# Patient Record
Sex: Female | Born: 1975 | Race: White | Hispanic: No | Marital: Married | Smoking: Former smoker
Health system: Southern US, Community
[De-identification: ages and names within clinical notes are randomized; demographics above are authoritative.]

## PROBLEM LIST (undated history)

## (undated) DIAGNOSIS — K219 Gastro-esophageal reflux disease without esophagitis: Secondary | ICD-10-CM

## (undated) DIAGNOSIS — I10 Essential (primary) hypertension: Secondary | ICD-10-CM

## (undated) DIAGNOSIS — M419 Scoliosis, unspecified: Secondary | ICD-10-CM

## (undated) DIAGNOSIS — N390 Urinary tract infection, site not specified: Secondary | ICD-10-CM

## (undated) DIAGNOSIS — N200 Calculus of kidney: Secondary | ICD-10-CM

## (undated) DIAGNOSIS — Z22322 Carrier or suspected carrier of Methicillin resistant Staphylococcus aureus: Secondary | ICD-10-CM

## (undated) DIAGNOSIS — Z87442 Personal history of urinary calculi: Secondary | ICD-10-CM

## (undated) HISTORY — PX: CYSTECTOMY: SUR359

## (undated) HISTORY — PX: KIDNEY STONE SURGERY: SHX686

---

## 2004-05-31 ENCOUNTER — Emergency Department (HOSPITAL_COMMUNITY): Admission: EM | Admit: 2004-05-31 | Discharge: 2004-05-31 | Payer: Self-pay | Admitting: *Deleted

## 2005-11-05 ENCOUNTER — Emergency Department (HOSPITAL_COMMUNITY): Admission: EM | Admit: 2005-11-05 | Discharge: 2005-11-05 | Payer: Self-pay | Admitting: Emergency Medicine

## 2005-11-07 ENCOUNTER — Ambulatory Visit: Payer: Self-pay | Admitting: Psychiatry

## 2005-11-07 ENCOUNTER — Other Ambulatory Visit (HOSPITAL_COMMUNITY): Admission: RE | Admit: 2005-11-07 | Discharge: 2005-12-07 | Payer: Self-pay | Admitting: Psychiatry

## 2008-09-29 ENCOUNTER — Observation Stay (HOSPITAL_COMMUNITY): Admission: EM | Admit: 2008-09-29 | Discharge: 2008-09-30 | Payer: Self-pay | Admitting: Emergency Medicine

## 2008-09-30 ENCOUNTER — Encounter (INDEPENDENT_AMBULATORY_CARE_PROVIDER_SITE_OTHER): Payer: Self-pay | Admitting: Urology

## 2010-02-19 ENCOUNTER — Emergency Department (HOSPITAL_COMMUNITY): Admission: EM | Admit: 2010-02-19 | Discharge: 2010-02-19 | Payer: Self-pay | Admitting: Emergency Medicine

## 2010-04-29 ENCOUNTER — Emergency Department (HOSPITAL_COMMUNITY): Admission: EM | Admit: 2010-04-29 | Discharge: 2010-04-29 | Payer: Self-pay | Admitting: Emergency Medicine

## 2010-07-07 ENCOUNTER — Emergency Department (HOSPITAL_COMMUNITY)
Admission: EM | Admit: 2010-07-07 | Discharge: 2010-07-07 | Payer: Self-pay | Source: Home / Self Care | Admitting: Emergency Medicine

## 2010-07-10 LAB — COMPREHENSIVE METABOLIC PANEL
ALT: 23 U/L (ref 0–35)
AST: 27 U/L (ref 0–37)
Albumin: 4.2 g/dL (ref 3.5–5.2)
Alkaline Phosphatase: 61 U/L (ref 39–117)
BUN: 9 mg/dL (ref 6–23)
CO2: 28 mEq/L (ref 19–32)
Calcium: 9.7 mg/dL (ref 8.4–10.5)
Chloride: 106 mEq/L (ref 96–112)
Creatinine, Ser: 0.73 mg/dL (ref 0.4–1.2)
GFR calc Af Amer: >60 mL/min (ref >60)
GFR calc non Af Amer: >60 mL/min (ref >60)
Glucose, Bld: 95 mg/dL (ref 70–99)
Potassium: 4.5 mEq/L (ref 3.5–5.1)
Sodium: 141 mEq/L (ref 135–145)
Total Bilirubin: 0.5 mg/dL (ref 0.3–1.2)
Total Protein: 7.9 g/dL (ref 6.0–8.3)

## 2010-07-10 LAB — URINALYSIS, ROUTINE W REFLEX MICROSCOPIC
Bilirubin Urine: NEGATIVE
Nitrite: POSITIVE — AB
Protein, ur: >300 mg/dL — AB
Specific Gravity, Urine: >1.03 — ABNORMAL HIGH (ref 1.005–1.030)
Urine Glucose, Fasting: 250 mg/dL — AB
Urobilinogen, UA: >8 mg/dL — ABNORMAL HIGH (ref 0.0–1.0)
pH: 5 (ref 5.0–8.0)

## 2010-07-10 LAB — DIFFERENTIAL
Basophils Absolute: 0.1 10*3/uL (ref 0.0–0.1)
Basophils Relative: 1 % (ref 0–1)
Eosinophils Absolute: 0.4 10*3/uL (ref 0.0–0.7)
Eosinophils Relative: 2 % (ref 0–5)
Lymphocytes Relative: 12 % (ref 12–46)
Lymphs Abs: 1.8 10*3/uL (ref 0.7–4.0)
Monocytes Absolute: 1.1 10*3/uL — ABNORMAL HIGH (ref 0.1–1.0)
Monocytes Relative: 7 % (ref 3–12)
Neutro Abs: 12 10*3/uL — ABNORMAL HIGH (ref 1.7–7.7)
Neutrophils Relative %: 78 % — ABNORMAL HIGH (ref 43–77)

## 2010-07-10 LAB — URINE MICROSCOPIC-ADD ON

## 2010-07-10 LAB — CBC
HCT: 39.8 % (ref 36.0–46.0)
Hemoglobin: 13.6 g/dL (ref 12.0–15.0)
MCH: 30.4 pg (ref 26.0–34.0)
MCHC: 34.2 g/dL (ref 30.0–36.0)
MCV: 88.8 fL (ref 78.0–100.0)
Platelets: 351 10*3/uL (ref 150–400)
RBC: 4.48 MIL/uL (ref 3.87–5.11)
RDW: 12.9 % (ref 11.5–15.5)
WBC: 15.4 10*3/uL — ABNORMAL HIGH (ref 4.0–10.5)

## 2010-07-10 LAB — PREGNANCY, URINE: Preg Test, Ur: NEGATIVE

## 2010-07-12 LAB — URINE CULTURE
Colony Count: 85000
Culture  Setup Time: 201201142022

## 2010-10-04 LAB — DIFFERENTIAL
Basophils Absolute: 0.1 10*3/uL (ref 0.0–0.1)
Basophils Absolute: 0.1 10*3/uL (ref 0.0–0.1)
Basophils Relative: 0 % (ref 0–1)
Basophils Relative: 1 % (ref 0–1)
Eosinophils Absolute: 0.3 10*3/uL (ref 0.0–0.7)
Eosinophils Absolute: 0.6 10*3/uL (ref 0.0–0.7)
Eosinophils Relative: 2 % (ref 0–5)
Eosinophils Relative: 5 % (ref 0–5)
Lymphocytes Relative: 7 % — ABNORMAL LOW (ref 12–46)
Lymphs Abs: 1.4 10*3/uL (ref 0.7–4.0)
Monocytes Absolute: 0.9 10*3/uL (ref 0.1–1.0)
Monocytes Relative: 4 % (ref 3–12)
Neutro Abs: 18.9 10*3/uL — ABNORMAL HIGH (ref 1.7–7.7)
Neutrophils Relative %: 58 % (ref 43–77)
Neutrophils Relative %: 88 % — ABNORMAL HIGH (ref 43–77)

## 2010-10-04 LAB — CBC
HCT: 37.9 % (ref 36.0–46.0)
HCT: 41.6 % (ref 36.0–46.0)
Hemoglobin: 14.2 g/dL (ref 12.0–15.0)
MCHC: 34.2 g/dL (ref 30.0–36.0)
MCHC: 34.2 g/dL (ref 30.0–36.0)
MCV: 89.6 fL (ref 78.0–100.0)
MCV: 89.9 fL (ref 78.0–100.0)
Platelets: 308 10*3/uL (ref 150–400)
Platelets: 330 10*3/uL (ref 150–400)
RBC: 4.64 MIL/uL (ref 3.87–5.11)
RDW: 13.2 % (ref 11.5–15.5)
RDW: 13.6 % (ref 11.5–15.5)
WBC: 11.5 10*3/uL — ABNORMAL HIGH (ref 4.0–10.5)
WBC: 21.6 10*3/uL — ABNORMAL HIGH (ref 4.0–10.5)

## 2010-10-04 LAB — BASIC METABOLIC PANEL
BUN: 6 mg/dL (ref 6–23)
BUN: 9 mg/dL (ref 6–23)
CO2: 29 mEq/L (ref 19–32)
Calcium: 9.5 mg/dL (ref 8.4–10.5)
Chloride: 103 mEq/L (ref 96–112)
Chloride: 107 mEq/L (ref 96–112)
Creatinine, Ser: 0.52 mg/dL (ref 0.4–1.2)
Creatinine, Ser: 0.68 mg/dL (ref 0.4–1.2)
GFR calc Af Amer: >60 mL/min (ref >60)
GFR calc non Af Amer: >60 mL/min (ref >60)
Glucose, Bld: 90 mg/dL (ref 70–99)
Glucose, Bld: 92 mg/dL (ref 70–99)
Potassium: 3.4 mEq/L — ABNORMAL LOW (ref 3.5–5.1)
Potassium: 4.5 mEq/L (ref 3.5–5.1)
Sodium: 139 mEq/L (ref 135–145)

## 2010-10-04 LAB — URINALYSIS, ROUTINE W REFLEX MICROSCOPIC
Glucose, UA: NEGATIVE mg/dL
Ketones, ur: NEGATIVE mg/dL
Nitrite: POSITIVE — AB
Protein, ur: 30 mg/dL — AB
Specific Gravity, Urine: >1.03 — ABNORMAL HIGH (ref 1.005–1.030)
Urobilinogen, UA: 1 mg/dL (ref 0.0–1.0)
pH: 5.5 (ref 5.0–8.0)

## 2010-10-04 LAB — STONE ANALYSIS: Stone Weight KSTONE: 0.091 g

## 2010-10-04 LAB — PREGNANCY, URINE: Preg Test, Ur: NEGATIVE

## 2010-10-04 LAB — URINE MICROSCOPIC-ADD ON

## 2010-10-04 LAB — URINE CULTURE: Colony Count: >=100000

## 2010-11-07 NOTE — Op Note (Signed)
NAME:  FATINA, SPRANKLE NO.:  [id number]   MEDICAL RECORD NO.:  [id number]          PATIENT TYPE:  OBV   LOCATION:  IC08                          FACILITY:  [facility]   PHYSICIAN:  Dennie Maizes, M.D.   DATE OF BIRTH:  04-Jan-1976   DATE OF PROCEDURE:  09/30/2008  DATE OF DISCHARGE:                               OPERATIVE REPORT   PREOPERATIVE DIAGNOSES:  Left distal ureteral calculi with obstruction,  left hydronephrosis, left renal colic.   POSTOPERATIVE DIAGNOSES:  Left distal ureteral calculi with obstruction,  left hydronephrosis, left renal colic.   PROCEDURE:  Operative cystoscopy, left retrograde pyelogram, left  ureteroscopic stone extraction, and left ureteral stent placement.   ANESTHESIA:  General.   SURGEON:  Dennie Maizes, MD   COMPLICATIONS:  None.   ESTIMATED BLOOD LOSS:  Minimal.   DRAINS:  6-French 26-cm size left ureteral stent with a string.   SPECIMEN:  Left ureteral calculus which was sent for chemical analysis.   A 35 year old female was admitted to hospital with severe left flank  pain.  Evaluation revealed a 7-mm size left upper ureteral calculus  without obstruction and hydronephrosis.  The patient was taken to  operating room today for cystoscopy, left retrograde pyelogram, left  ureteral stent placement, and possible ureteroscopic stone extraction.   DESCRIPTION OF PROCEDURE:  Preprocedure KUB revealed that the stone has  migrated to the left ureterovesical junction area.  General anesthesia  was induced and the patient was placed on the OR table in the dorsal  lithotomy position.  Lower abdomen and genitalia were prepped and draped  in a sterile fashion.  Cystoscopy was done with the 25-French scope.  Appearance of the bladder was normal.  A 5-French wedge catheter was  then placed in the left ureteral orifice.  Total 7 mL of IVP dye was  injected into the collecting system and retrograde pyelogram was done.  There was a large  filling defect in the distal ureter just above the  ureteral second junction measuring about 7 mm in size.  There was  proximal hydroureter and hydronephrosis.   An open-ended catheter was then placed in the left ureter.  A 0.138  Bentson guide was then inserted in the left collecting system without  any difficulty.  Distal ureter was dilated using an 18-French 6-cm  length balloon dilating catheter.  The balloon dilating catheter then  removed leaving the guidewire in place.   Ureteroscopy was done with a 8-French rigid ureteroscope.  The stone was  seen in the distal ureter.  The stone was trapped in a nitinol 14-mm 0-  tube wire basket and removed without any difficulty.  Examination of the  ureter up to the pelvic brim was normal.  A 6-French 26-cm size stent  with a string was then inserted in the left collecting system.  The  instruments were removed.  The patient was transferred to the PACU in a  satisfactory condition.      Dennie Maizes, M.D.  Electronically Signed     SK/MEDQ  D:  09/30/2008  T:  09/30/2008  Job:  [id number]   cc:   [facility]

## 2010-11-07 NOTE — H&P (Signed)
NAME:  Kathleen Perez, BUCHAN NO.:  [id number]   MEDICAL RECORD NO.:  [id number]          PATIENT TYPE:  EMS   LOCATION:  ED                            FACILITY:  [facility]   PHYSICIAN:  Dennie Maizes, M.D.   DATE OF BIRTH:  [birth year]   DATE OF ADMISSION:  09/29/2008  DATE OF DISCHARGE:  LH                              HISTORY & PHYSICAL   CHIEF COMPLAINT:  Severe left flank pain radiating to the front.   HISTORY OF PRESENT ILLNESS:  This 35 year old female has a past medical  history of recurrent urolithiasis.  She has passed several small stones  in the past.  She has not had any surgical intervention for stone  disease.  She has been treated for recurrent urinary tract infections in  the recent past.  She experienced sudden onset of severe left flank pain  radiating to the front since 3 a.m. today.  She came to the emergency  room at [facility] for further evaluation and management.  Her  pain scale was 8/10.  She denied having fever, chills, voiding  difficulty, gross hematuria or dysuria.   PAST MEDICAL HISTORY:  History of migraine headaches, recurrent urinary  tract infections.   MEDICATIONS:  None.   ALLERGIES:  None.   EXAMINATION:  The patient is comfortable after receiving IV narcotics.  HEAD, EYES, EARS, NOSE AND THROAT:  Normal.  LUNGS:  Clear to auscultation.  HEART:  Regular rate and rhythm.  No murmurs.  ABDOMEN:  Soft.  No palpable flank mass.  Moderate left costovertebral  angle tenderness was noted.   ADMISSION LABS:  Urinalysis:  Blood large, nitrate positive, leukocyte  esterase trace.  Urine pregnancy test negative.  CBC:  WBC 21.6,  hemoglobin 14.2, hematocrit 14.6.  BUN 9, creatinine 0.68, potassium  3.4.  Noncontrast CT scan of the abdomen and pelvis revealed moderate  left hydronephrosis with the proximal hydroureter.  There was a 9 x 5 x  5-mm size stone in the left proximal ureter.  There were no stones in  the right collecting  system.  The patient also has a right ovarian mass  about 4.7 x 3.8 cm in size.  It may be due to complex cyst or solid  mass.  Pelvic sonography is recommended by the radiologist.   IMPRESSION:  1. Left upper ureteral calculus with obstruction.  2. Left hydronephrosis.  3. Left renal colic.  4. Possible urinary tract infection and pyelonephritis.  5. Right adnexal mass.   PLAN:  1. Urine culture and sensitivity today.  The patient has been started      on IV Cipro.  2. Morphine PCA for pain relief.  3. Admit the patient for observation in the hospital.  4. I have discussed with the patient and her family regarding      management options.  She is scheduled to undergo cystoscopy, left      retrograde pyelogram and left ureteral stent placement under      anesthesia on September 30, 2008.  The obstructing stone will later be  treated with ESL as an outpatient.      Dennie Maizes, M.D.  Electronically Signed     SK/MEDQ  D:  09/29/2008  T:  09/29/2008  Job:  [id number]   cc:   [facility]

## 2010-11-13 ENCOUNTER — Emergency Department (HOSPITAL_COMMUNITY)
Admission: EM | Admit: 2010-11-13 | Discharge: 2010-11-13 | Disposition: A | Discharge status: Home / Self Care | Payer: Self-pay | Attending: Emergency Medicine | Admitting: Emergency Medicine

## 2010-11-13 DIAGNOSIS — R569 Unspecified convulsions: Secondary | ICD-10-CM | POA: Insufficient documentation

## 2010-11-13 LAB — POCT PREGNANCY, URINE: Preg Test, Ur: NEGATIVE

## 2011-02-14 ENCOUNTER — Emergency Department (HOSPITAL_COMMUNITY)
Admission: EM | Admit: 2011-02-14 | Discharge: 2011-02-14 | Disposition: A | Discharge status: Home / Self Care | Payer: Self-pay | Attending: Emergency Medicine | Admitting: Emergency Medicine

## 2011-02-14 DIAGNOSIS — Z87442 Personal history of urinary calculi: Secondary | ICD-10-CM | POA: Insufficient documentation

## 2011-02-14 DIAGNOSIS — R300 Dysuria: Secondary | ICD-10-CM | POA: Insufficient documentation

## 2011-02-14 DIAGNOSIS — N390 Urinary tract infection, site not specified: Secondary | ICD-10-CM

## 2011-02-14 DIAGNOSIS — F17200 Nicotine dependence, unspecified, uncomplicated: Secondary | ICD-10-CM | POA: Insufficient documentation

## 2011-02-14 DIAGNOSIS — R350 Frequency of micturition: Secondary | ICD-10-CM | POA: Insufficient documentation

## 2011-02-14 DIAGNOSIS — R109 Unspecified abdominal pain: Secondary | ICD-10-CM | POA: Insufficient documentation

## 2011-02-14 DIAGNOSIS — R319 Hematuria, unspecified: Secondary | ICD-10-CM | POA: Insufficient documentation

## 2011-02-14 HISTORY — DX: Urinary tract infection, site not specified: N39.0

## 2011-02-14 HISTORY — DX: Carrier or suspected carrier of methicillin resistant Staphylococcus aureus: Z22.322

## 2011-02-14 HISTORY — DX: Scoliosis, unspecified: M41.9

## 2011-02-14 HISTORY — DX: Calculus of kidney: N20.0

## 2011-02-14 LAB — URINALYSIS, ROUTINE W REFLEX MICROSCOPIC
Glucose, UA: 250 mg/dL — AB
Hgb urine dipstick: NEGATIVE
Protein, ur: 100 mg/dL — AB
Urobilinogen, UA: >8 mg/dL — ABNORMAL HIGH (ref 0.0–1.0)

## 2011-02-14 LAB — URINE MICROSCOPIC-ADD ON

## 2011-02-14 LAB — POCT PREGNANCY, URINE: Preg Test, Ur: NEGATIVE

## 2011-02-14 MED ORDER — CEPHALEXIN 500 MG PO CAPS: 500.0000 mg | ORAL_CAPSULE | Freq: Four times a day (QID) | ORAL | Status: AC

## 2011-02-14 MED ORDER — CEPHALEXIN 500 MG PO CAPS
500.0000 mg | ORAL_CAPSULE | Freq: Once | ORAL | Status: AC
  Administered 2011-02-14: 500 mg via ORAL
  Filled 2011-02-14: qty 1

## 2011-02-14 MED ORDER — HYDROCODONE-ACETAMINOPHEN 5-325 MG PO TABS: ORAL_TABLET | ORAL | Status: AC

## 2011-02-14 NOTE — ED Provider Notes (Signed)
History     CSN: [id number] Arrival date & time: 02/14/2011  9:50 AM  Chief Complaint  Patient presents with  . Urinary Frequency  . Dysuria   Patient is a 35 y.o. female presenting with dysuria. The history is provided by the patient.  Dysuria  This is a new problem. The current episode started more than 2 days ago. The problem occurs every urination. The problem has been gradually worsening. The quality of the pain is described as burning. The pain is moderate. There has been no fever. She is sexually active. There is no history of pyelonephritis. Associated symptoms include frequency, hematuria and urgency. Pertinent negatives include no chills, no sweats, no vomiting, no discharge, no possible pregnancy and no flank pain. She has tried nothing for the symptoms. Her past medical history is significant for kidney stones and recurrent UTIs. Her past medical history does not include urological procedure or catheterization.    Past Medical History  Diagnosis Date  . Kidney stone   . Scoliosis   . Urinary tract infection   . MRSA (methicillin resistant staph aureus) culture positive     Past Surgical History  Procedure Date  . Kidney stone surgery   . Cystectomy     No family history on file.  History  Substance Use Topics  . Smoking status: Current Everyday Smoker  . Smokeless tobacco: Not on file  . Alcohol Use: No    OB History    Grav Para Term Preterm Abortions TAB SAB Ect Mult Living                  Review of Systems  Constitutional: Negative for fever and chills.  Gastrointestinal: Negative for vomiting.  Genitourinary: Positive for dysuria, urgency, frequency and hematuria. Negative for flank pain, vaginal bleeding, vaginal discharge, menstrual problem and pelvic pain.  Musculoskeletal: Negative for back pain.  All other systems reviewed and are negative.    Physical Exam  BP 140/93  Pulse 73  Temp(Src) 98.4 F (36.9 C) (Oral)  Resp 20  Ht 5\' 6"  (1.676  m)  Wt 174 lb (78.926 kg)  BMI 28.08 kg/m2  SpO2 95%  LMP 01/25/2011  Physical Exam  Nursing note and vitals reviewed. Constitutional: She is oriented to person, place, and time. She appears well-developed and well-nourished. No distress.  HENT:  Head: Normocephalic and atraumatic.  Neck: Normal range of motion. No thyromegaly present.  Cardiovascular: Normal rate, regular rhythm and normal heart sounds.   Pulmonary/Chest: Effort normal and breath sounds normal.  Abdominal: Soft. She exhibits no distension and no mass. There is no hepatosplenomegaly. There is tenderness in the suprapubic area. There is no rigidity, no rebound, no guarding, no CVA tenderness and no tenderness at McBurney's point.  Musculoskeletal: She exhibits no edema and no tenderness.  Lymphadenopathy:    She has no cervical adenopathy.  Neurological: She is alert and oriented to person, place, and time.  Skin: Skin is warm and dry.    ED Course  Procedures  MDM   Patient is alert, NAd.  Non-toxic appearing.  No fever , vomiting or CVA tenderness to suggest pyleo. Onset of sx's has been gradual. Urine culture is pending.  Likely UTI.  I will start abx and pt agrees to rturn id sx's worsen.    I have reviewed the nursing notes and vitals signs.    Patient / Family / Caregiver understand and agree with initial ED impression and plan with expectations set for ED visit.  The patient appears reasonably screened and/or stabilized for discharge and I doubt any other medical condition or other [facility] requiring further screening, evaluation, or treatment in the ED at this time prior to discharge.       Dontrey Snellgrove L. Raimi Guillermo, [place] 02/22/11 1435

## 2011-02-14 NOTE — ED Notes (Signed)
Pt reports dysuria, pelvic pressure, and urinary frequency for the past week.  Pt has a hx of UTI, and kidney stones.  Pt reports that the pressure is getting worse.

## 2011-02-14 NOTE — ED Notes (Signed)
Left in c/o spouse for transport home; a&ox4; in no distress 

## 2011-02-16 LAB — URINE CULTURE: Colony Count: 80000

## 2011-02-17 NOTE — ED Notes (Signed)
Positive urine culture. Tx'd with Keflex, sensitive to same per protocol MD.

## 2011-02-23 NOTE — ED Provider Notes (Signed)
Medical screening examination/treatment/procedure(s) were performed by non-physician practitioner and as supervising physician I was immediately available for consultation/collaboration.   Benny Lennert, MD 02/23/11 [phone]

## 2012-05-08 ENCOUNTER — Encounter (HOSPITAL_COMMUNITY): Payer: Self-pay | Admitting: *Deleted

## 2012-05-08 ENCOUNTER — Emergency Department (HOSPITAL_COMMUNITY)
Admission: EM | Admit: 2012-05-08 | Discharge: 2012-05-08 | Disposition: A | Discharge status: Home / Self Care | Payer: Self-pay | Attending: Emergency Medicine | Admitting: Emergency Medicine

## 2012-05-08 DIAGNOSIS — Z87440 Personal history of urinary (tract) infections: Secondary | ICD-10-CM | POA: Insufficient documentation

## 2012-05-08 DIAGNOSIS — F17200 Nicotine dependence, unspecified, uncomplicated: Secondary | ICD-10-CM | POA: Insufficient documentation

## 2012-05-08 DIAGNOSIS — Z8739 Personal history of other diseases of the musculoskeletal system and connective tissue: Secondary | ICD-10-CM | POA: Insufficient documentation

## 2012-05-08 DIAGNOSIS — Z87442 Personal history of urinary calculi: Secondary | ICD-10-CM | POA: Insufficient documentation

## 2012-05-08 DIAGNOSIS — K089 Disorder of teeth and supporting structures, unspecified: Secondary | ICD-10-CM | POA: Insufficient documentation

## 2012-05-08 DIAGNOSIS — K0889 Other specified disorders of teeth and supporting structures: Secondary | ICD-10-CM

## 2012-05-08 DIAGNOSIS — Z8614 Personal history of Methicillin resistant Staphylococcus aureus infection: Secondary | ICD-10-CM | POA: Insufficient documentation

## 2012-05-08 MED ORDER — HYDROCODONE-ACETAMINOPHEN 5-325 MG PO TABS: 1.0000 | ORAL_TABLET | Freq: Four times a day (QID) | ORAL | Status: AC | PRN

## 2012-05-08 MED ORDER — PENICILLIN V POTASSIUM 250 MG PO TABS
500.0000 mg | ORAL_TABLET | Freq: Once | ORAL | Status: AC
  Administered 2012-05-08: 500 mg via ORAL
  Filled 2012-05-08: qty 2

## 2012-05-08 MED ORDER — HYDROCODONE-ACETAMINOPHEN 5-325 MG PO TABS
1.0000 | ORAL_TABLET | Freq: Once | ORAL | Status: AC
  Administered 2012-05-08: 1 via ORAL
  Filled 2012-05-08: qty 1

## 2012-05-08 MED ORDER — PENICILLIN V POTASSIUM 500 MG PO TABS: 500.0000 mg | ORAL_TABLET | Freq: Four times a day (QID) | ORAL | Status: AC

## 2012-05-08 NOTE — ED Provider Notes (Signed)
History     CSN: [id number]  Arrival date & time 05/08/12  1447   First MD Initiated Contact with Patient 05/08/12 1604      Chief Complaint  Patient presents with  . Dental Pain    (Consider location/radiation/quality/duration/timing/severity/associated sxs/prior treatment) HPI Comments: R lower wisdom tooth began hurting  This AM.  Took ibuprofen 600 mg  At 1130 with minimal relief.  Has appt to see dr. Retta Mac for extraction next week.  No fever or chills.  The history is provided by the patient. No language interpreter was used.    Past Medical History  Diagnosis Date  . Kidney stone   . Scoliosis   . Urinary tract infection   . MRSA (methicillin resistant staph aureus) culture positive     Past Surgical History  Procedure Date  . Kidney stone surgery   . Cystectomy     No family history on file.  History  Substance Use Topics  . Smoking status: Current Every Day Smoker  . Smokeless tobacco: Not on file  . Alcohol Use: No    OB History    Grav Para Term Preterm Abortions TAB SAB Ect Mult Living                  Review of Systems  Constitutional: Negative for fever and chills.  HENT: Positive for dental problem.   Cardiovascular: Negative for chest pain.  All other systems reviewed and are negative.    Allergies  Review of patient's allergies indicates no known allergies.  Home Medications   Current Outpatient Rx  Name  Route  Sig  Dispense  Refill  . PRENATAL MULTIVITAMIN CH   Oral   Take 1 tablet by mouth daily.         Marland Kitchen VITAMIN B-6 PO   Oral   Take 1 tablet by mouth daily.         Marland Kitchen HYDROCODONE-ACETAMINOPHEN 5-325 MG PO TABS   Oral   Take 1 tablet by mouth every 6 (six) hours as needed for pain.   20 tablet   0   . PENICILLIN V POTASSIUM 500 MG PO TABS   Oral   Take 1 tablet (500 mg total) by mouth 4 (four) times daily.   40 tablet   0     BP 147/94  Pulse 86  Temp 98.2 F (36.8 C) (Oral)  Resp 16  Ht 5\' 6"  (1.676  m)  Wt 200 lb (90.719 kg)  BMI 32.28 kg/m2  SpO2 98%  LMP 04/08/2012  Physical Exam  Nursing note and vitals reviewed. Constitutional: She is oriented to person, place, and time. She appears well-developed and well-nourished. No distress.  HENT:  Head: Normocephalic and atraumatic.  Mouth/Throat: Uvula is midline, oropharynx is clear and moist and mucous membranes are normal. No uvula swelling.    Eyes: EOM are normal.  Neck: Normal range of motion.  Cardiovascular: Normal rate and regular rhythm.   Pulmonary/Chest: Effort normal.  Abdominal: Soft. She exhibits no distension. There is no tenderness.  Musculoskeletal: Normal range of motion.  Neurological: She is alert and oriented to person, place, and time.  Skin: Skin is warm and dry.  Psychiatric: She has a normal mood and affect. Judgment normal.    ED Course  Procedures (including critical care time)  Labs Reviewed - No data to display No results found.   1. Pain, dental       MDM  rx-pen VK 500 mgQID x 10 days  rx-hydrocodone, 20 Ibuprofen F/u with dr. Retta Mac as planned.        Evalina Field, [place] 05/08/12 [phone]

## 2012-05-08 NOTE — ED Notes (Signed)
C/o dental pain to right side that started today.

## 2012-05-09 NOTE — ED Provider Notes (Signed)
History/physical exam/procedure(s) were performed by non-physician practitioner and as supervising physician I was immediately available for consultation/collaboration. I have reviewed all notes and am in agreement with care and plan.   Cresencia Asmus S Laureano Hetzer, MD 05/09/12 1513 

## 2013-06-08 ENCOUNTER — Encounter (HOSPITAL_COMMUNITY): Payer: Self-pay | Admitting: Emergency Medicine

## 2013-06-08 ENCOUNTER — Emergency Department (HOSPITAL_COMMUNITY)
Admission: EM | Admit: 2013-06-08 | Discharge: 2013-06-08 | Disposition: A | Discharge status: Home / Self Care | Payer: BC Managed Care – PPO | Attending: Emergency Medicine | Admitting: Emergency Medicine

## 2013-06-08 DIAGNOSIS — Z8739 Personal history of other diseases of the musculoskeletal system and connective tissue: Secondary | ICD-10-CM | POA: Insufficient documentation

## 2013-06-08 DIAGNOSIS — Z87440 Personal history of urinary (tract) infections: Secondary | ICD-10-CM | POA: Insufficient documentation

## 2013-06-08 DIAGNOSIS — F17200 Nicotine dependence, unspecified, uncomplicated: Secondary | ICD-10-CM | POA: Insufficient documentation

## 2013-06-08 DIAGNOSIS — Z87442 Personal history of urinary calculi: Secondary | ICD-10-CM | POA: Insufficient documentation

## 2013-06-08 DIAGNOSIS — K089 Disorder of teeth and supporting structures, unspecified: Secondary | ICD-10-CM | POA: Insufficient documentation

## 2013-06-08 DIAGNOSIS — Z79899 Other long term (current) drug therapy: Secondary | ICD-10-CM | POA: Insufficient documentation

## 2013-06-08 DIAGNOSIS — K029 Dental caries, unspecified: Secondary | ICD-10-CM | POA: Insufficient documentation

## 2013-06-08 DIAGNOSIS — Z8614 Personal history of Methicillin resistant Staphylococcus aureus infection: Secondary | ICD-10-CM | POA: Insufficient documentation

## 2013-06-08 DIAGNOSIS — K0889 Other specified disorders of teeth and supporting structures: Secondary | ICD-10-CM

## 2013-06-08 MED ORDER — IBUPROFEN 600 MG PO TABS: 600.0000 mg | ORAL_TABLET | Freq: Four times a day (QID) | ORAL | Status: DC | PRN

## 2013-06-08 MED ORDER — TRAMADOL HCL 50 MG PO TABS: 50.0000 mg | ORAL_TABLET | Freq: Four times a day (QID) | ORAL | Status: DC | PRN

## 2013-06-08 MED ORDER — AMOXICILLIN 500 MG PO CAPS: 500.0000 mg | ORAL_CAPSULE | Freq: Three times a day (TID) | ORAL | Status: AC

## 2013-06-08 NOTE — ED Notes (Signed)
Pt with dental pain since 0300 this morning, unable to see dentist

## 2013-06-11 NOTE — ED Provider Notes (Signed)
CSN: [id number]     Arrival date & time 06/08/13  1110 History   First MD Initiated Contact with Patient 06/08/13 1214     Chief Complaint  Patient presents with  . Dental Pain   (Consider location/radiation/quality/duration/timing/severity/associated sxs/prior Treatment) HPI Comments: Kathleen Perez is a 37 y.o. Female presenting with a 1 day history of dental pain and gingival swelling.   She denies prior problems with the tooth except has a cavity in the tooth.  Pain woke her from sleep around 3 am today.  There has been no fevers,  Chills, nausea or vomiting, also no complaint of difficulty swallowing,  Although chewing makes pain worse.  The patient has tried ibuprofen without relief of symptoms.         The history is provided by the patient.    Past Medical History  Diagnosis Date  . Kidney stone   . Scoliosis   . Urinary tract infection   . MRSA (methicillin resistant staph aureus) culture positive    Past Surgical History  Procedure Laterality Date  . Kidney stone surgery    . Cystectomy     History reviewed. No pertinent family history. History  Substance Use Topics  . Smoking status: Current Every Day Smoker    Types: Cigarettes  . Smokeless tobacco: Not on file  . Alcohol Use: No   OB History   Grav Para Term Preterm Abortions TAB SAB Ect Mult Living                 Review of Systems  Constitutional: Negative for fever.  HENT: Positive for dental problem. Negative for facial swelling and sore throat.   Respiratory: Negative for shortness of breath.   Musculoskeletal: Negative for neck pain and neck stiffness.    Allergies  Review of patient's allergies indicates no known allergies.  Home Medications   Current Outpatient Rx  Name  Route  Sig  Dispense  Refill  . ferrous sulfate 325 (65 FE) MG tablet   Oral   Take 325 mg by mouth daily with breakfast.         . norethindrone-ethinyl estradiol (NECON,BREVICON,MODICON) 0.5-35 MG-MCG tablet  Oral   Take 1 tablet by mouth daily.         Marland Kitchen amoxicillin (AMOXIL) 500 MG capsule   Oral   Take 1 capsule (500 mg total) by mouth 3 (three) times daily.   30 capsule   0   . ibuprofen (ADVIL,MOTRIN) 600 MG tablet   Oral   Take 1 tablet (600 mg total) by mouth every 6 (six) hours as needed.   30 tablet   0   . traMADol (ULTRAM) 50 MG tablet   Oral   Take 1 tablet (50 mg total) by mouth every 6 (six) hours as needed.   15 tablet   0    BP 154/96  Pulse 93  Temp(Src) 98 F (36.7 C) (Oral)  Resp 20  Ht 5\' 6"  (1.676 m)  Wt 210 lb (95.255 kg)  BMI 33.91 kg/m2  SpO2 98%  LMP 05/22/2013 Physical Exam  Constitutional: She is oriented to person, place, and time. She appears well-developed and well-nourished. No distress.  HENT:  Head: Normocephalic and atraumatic.  Right Ear: Tympanic membrane and external ear normal.  Left Ear: Tympanic membrane and external ear normal.  Mouth/Throat: Oropharynx is clear and moist and mucous membranes are normal. No oral lesions. Dental caries present.    Surrounding gingival erythema. No fluctuance or edema.  Eyes: Conjunctivae are normal.  Neck: Normal range of motion. Neck supple.  Cardiovascular: Normal rate and normal heart sounds.   Pulmonary/Chest: Effort normal.  Abdominal: She exhibits no distension.  Musculoskeletal: Normal range of motion.  Lymphadenopathy:    She has no cervical adenopathy.  Neurological: She is alert and oriented to person, place, and time.  Skin: Skin is warm and dry. No erythema.  Psychiatric: She has a normal mood and affect.    ED Course  Procedures (including critical care time) Labs Review Labs Reviewed - No data to display Imaging Review No results found.  EKG Interpretation   None       MDM   1. Pain, dental    Probably early dental infection. Prescribed amoxil, ibuprofen, tramadol.  Dental referrals given.    Burgess Amor, PA-C 06/11/13 1939

## 2013-06-12 NOTE — ED Provider Notes (Signed)
Medical screening examination/treatment/procedure(s) were performed by non-physician practitioner and as supervising physician I was immediately available for consultation/collaboration.  EKG Interpretation   None        Donnetta Hutching, MD 06/12/13 1826

## 2013-12-16 ENCOUNTER — Encounter (HOSPITAL_COMMUNITY): Payer: Self-pay | Admitting: Emergency Medicine

## 2013-12-16 DIAGNOSIS — F17200 Nicotine dependence, unspecified, uncomplicated: Secondary | ICD-10-CM | POA: Insufficient documentation

## 2013-12-16 DIAGNOSIS — Z8614 Personal history of Methicillin resistant Staphylococcus aureus infection: Secondary | ICD-10-CM | POA: Insufficient documentation

## 2013-12-16 DIAGNOSIS — Z8739 Personal history of other diseases of the musculoskeletal system and connective tissue: Secondary | ICD-10-CM | POA: Insufficient documentation

## 2013-12-16 DIAGNOSIS — Z79899 Other long term (current) drug therapy: Secondary | ICD-10-CM | POA: Insufficient documentation

## 2013-12-16 DIAGNOSIS — Z3202 Encounter for pregnancy test, result negative: Secondary | ICD-10-CM | POA: Insufficient documentation

## 2013-12-16 DIAGNOSIS — Z9089 Acquired absence of other organs: Secondary | ICD-10-CM | POA: Insufficient documentation

## 2013-12-16 DIAGNOSIS — Z87440 Personal history of urinary (tract) infections: Secondary | ICD-10-CM | POA: Insufficient documentation

## 2013-12-16 DIAGNOSIS — R109 Unspecified abdominal pain: Secondary | ICD-10-CM | POA: Insufficient documentation

## 2013-12-16 DIAGNOSIS — Z87442 Personal history of urinary calculi: Secondary | ICD-10-CM | POA: Insufficient documentation

## 2013-12-16 NOTE — ED Notes (Signed)
Pt c/o left flank pain  X 2 days.

## 2013-12-17 ENCOUNTER — Emergency Department (HOSPITAL_COMMUNITY)
Admission: EM | Admit: 2013-12-17 | Discharge: 2013-12-17 | Disposition: A | Discharge status: Home / Self Care | Payer: BC Managed Care – PPO | Attending: Emergency Medicine | Admitting: Emergency Medicine

## 2013-12-17 DIAGNOSIS — R109 Unspecified abdominal pain: Secondary | ICD-10-CM

## 2013-12-17 LAB — URINALYSIS, ROUTINE W REFLEX MICROSCOPIC
BILIRUBIN URINE: NEGATIVE
Glucose, UA: NEGATIVE mg/dL
Hgb urine dipstick: NEGATIVE
Ketones, ur: NEGATIVE mg/dL
Leukocytes, UA: NEGATIVE
NITRITE: NEGATIVE
PROTEIN: 30 mg/dL — AB
UROBILINOGEN UA: 0.2 mg/dL (ref 0.0–1.0)
pH: 5.5 (ref 5.0–8.0)

## 2013-12-17 LAB — URINE MICROSCOPIC-ADD ON

## 2013-12-17 LAB — POC URINE PREG, ED: Preg Test, Ur: NEGATIVE

## 2013-12-17 MED ORDER — OXYCODONE-ACETAMINOPHEN 5-325 MG PO TABS: 1.0000 | ORAL_TABLET | ORAL | Status: DC | PRN

## 2013-12-17 MED ORDER — NAPROXEN 500 MG PO TABS: 500.0000 mg | ORAL_TABLET | Freq: Two times a day (BID) | ORAL | Status: DC

## 2013-12-17 MED ORDER — NITROFURANTOIN MONOHYD MACRO 100 MG PO CAPS
100.0000 mg | ORAL_CAPSULE | Freq: Two times a day (BID) | ORAL | Status: DC
Start: 2013-12-17 — End: 2015-07-12

## 2013-12-17 MED ORDER — IBUPROFEN 800 MG PO TABS
800.0000 mg | ORAL_TABLET | Freq: Once | ORAL | Status: AC
  Administered 2013-12-17: 800 mg via ORAL
  Filled 2013-12-17: qty 1

## 2013-12-17 MED ORDER — NITROFURANTOIN MACROCRYSTAL 100 MG PO CAPS
100.0000 mg | ORAL_CAPSULE | Freq: Once | ORAL | Status: AC
  Administered 2013-12-17: 100 mg via ORAL
  Filled 2013-12-17: qty 1

## 2013-12-17 MED ORDER — NITROFURANTOIN MACROCRYSTAL 50 MG PO CAPS
ORAL_CAPSULE | ORAL | Status: AC
  Filled 2013-12-17: qty 2

## 2013-12-17 NOTE — ED Provider Notes (Signed)
CSN: [id number]     Arrival date & time 12/16/13  2335 History   This chart was scribed for Kathleen Boozeavid Glick, MD, by Yevette EdwardsAngela Bracken, ED Scribe. This patient was seen in room [facility]14[facility]14 and the patient's care was started at 12:31 AM. First MD Initiated Contact with Patient 12/17/13 0028     Chief Complaint  Patient presents with  . Flank Pain    The history is provided by the patient. No language interpreter was used.   HPI Comments: Kathleen LombardLaura N Perez is a 38 y.o. female, with a h/o rental calculi and UTI, who presents to the Emergency Department complaining of left-sided flank pain which radiates to her back. She characterizes the pain as "dull and achy." She reports the pain has stayed the same since its onset with the maximum pain at 5/10 and the current pain at 4/10. Ms. Kathleen Perez reports that sitting eases the pain, but she is unsure if anything increases the pain. She has used one percocet and 800 mg IB with moderate relief. She denies urinary symptoms, nausea, emesis, fever, or chills. She reports the pain is similar to previous episodes of renal calculi, though less severe. The pt has a h/o abdominal surgery.   Past Medical History  Diagnosis Date  . Kidney stone   . Scoliosis   . Urinary tract infection   . MRSA (methicillin resistant staph aureus) culture positive    Past Surgical History  Procedure Laterality Date  . Kidney stone surgery    . Cystectomy     History reviewed. No pertinent family history. History  Substance Use Topics  . Smoking status: Current Every Day Smoker    Types: Cigarettes  . Smokeless tobacco: Not on file  . Alcohol Use: No   No OB history provided.  Review of Systems  Constitutional: Negative for fever and chills.  Gastrointestinal: Negative for nausea and vomiting.  Genitourinary: Positive for flank pain. Negative for dysuria, frequency, hematuria, decreased urine volume and difficulty urinating.  Musculoskeletal: Positive for back pain.  All other  systems reviewed and are negative.   Allergies  Review of patient's allergies indicates no known allergies.  Home Medications   Prior to Admission medications   Medication Sig Start Date End Date Taking? Authorizing Provider  ferrous sulfate 325 (65 FE) MG tablet Take 325 mg by mouth daily with breakfast.   Yes Historical Provider, MD  norethindrone-ethinyl estradiol (NECON,BREVICON,MODICON) 0.5-35 MG-MCG tablet Take 1 tablet by mouth daily.   Yes Historical Provider, MD  ibuprofen (ADVIL,MOTRIN) 600 MG tablet Take 1 tablet (600 mg total) by mouth every 6 (six) hours as needed. 06/08/13   Burgess AmorJulie Idol, PA-C  traMADol (ULTRAM) 50 MG tablet Take 1 tablet (50 mg total) by mouth every 6 (six) hours as needed. 06/08/13   Burgess AmorJulie Idol, PA-C   Triage Vitals: BP 165/100  Pulse 88  Temp(Src) 97.9 F (36.6 C) (Oral)  Resp 17  Ht 5\' 7"  (1.702 m)  Wt 206 lb (93.441 kg)  BMI 32.26 kg/m2  SpO2 99%  LMP 11/22/2013  Physical Exam  Nursing note and vitals reviewed. Constitutional: She is oriented to person, place, and time. She appears well-developed and well-nourished. No distress.  HENT:  Head: Normocephalic and atraumatic.  Eyes: Conjunctivae and EOM are normal. Pupils are equal, round, and reactive to light.  Neck: Neck supple. No JVD present. No tracheal deviation present.  Cardiovascular: Normal rate, regular rhythm and normal heart sounds.   No murmur heard. Pulmonary/Chest: Effort normal and breath  sounds normal. No respiratory distress. She has no wheezes. She has no rales.  Abdominal: Soft. Bowel sounds are normal. She exhibits no distension. There is no tenderness. There is no rebound and no guarding.  Musculoskeletal: Normal range of motion. She exhibits no edema.  Lymphadenopathy:    She has no cervical adenopathy.  Neurological: She is alert and oriented to person, place, and time. She has normal reflexes. No cranial nerve deficit. Coordination normal.  Skin: Skin is warm and dry.  No rash noted.  Psychiatric: She has a normal mood and affect. Her behavior is normal. Thought content normal.    ED Course  Procedures (including critical care time)  DIAGNOSTIC STUDIES: Oxygen Saturation is 99% on room air, normal by my interpretation.    COORDINATION OF CARE:  12:39 AM- Discussed treatment plan with patient, and the patient agreed to the plan. The plan includes lab work and pan medication.   Labs Review Results for orders placed during the hospital encounter of 12/17/13  URINALYSIS, ROUTINE W REFLEX MICROSCOPIC      Result Value Ref Range   Color, Urine YELLOW  YELLOW   APPearance CLEAR  CLEAR   Specific Gravity, Urine >1.030 (*) 1.005 - 1.030   pH 5.5  5.0 - 8.0   Glucose, UA NEGATIVE  NEGATIVE mg/dL   Hgb urine dipstick NEGATIVE  NEGATIVE   Bilirubin Urine NEGATIVE  NEGATIVE   Ketones, ur NEGATIVE  NEGATIVE mg/dL   Protein, ur 30 (*) NEGATIVE mg/dL   Urobilinogen, UA 0.2  0.0 - 1.0 mg/dL   Nitrite NEGATIVE  NEGATIVE   Leukocytes, UA NEGATIVE  NEGATIVE  URINE MICROSCOPIC-ADD ON      Result Value Ref Range   Squamous Epithelial / LPF MANY (*) RARE   WBC, UA 7-10  <3 WBC/hpf   RBC / HPF 0-2  <3 RBC/hpf   Bacteria, UA MANY (*) RARE  POC URINE PREG, ED      Result Value Ref Range   Preg Test, Ur NEGATIVE  NEGATIVE    MDM   Final diagnoses:  Left flank pain    Flank pain of uncertain cause. She does not act like a typical kidney stone and does not have urinary symptoms to suggest urinary tract infection. Old review of old records shows that she did have a kidney stone 5 years ago with no other renal calculi seen at that time. Urinalysis will be checked to see if she has any hematuria.  Urine is negative for blood. Bacteria are seen but sore epithelial cells suggesting a contaminated specimen. Patient is concerned that she does have a UTI so she will be given empiric treatment with nitrofurantoin. She's given prescription for naproxen and for oxycodone  acetaminophen. At this point, I feel her pain is most likely muscular but she is advised to return should she develop a fever worsening pain.  I personally performed the services described in this documentation, which was scribed in my presence. The recorded information has been reviewed and is accurate.       Kathleen Boozeavid Glick, MD 12/17/13 Earle [id number]

## 2013-12-17 NOTE — Discharge Instructions (Signed)
Your urine did not show any blood and it is from likely that this is a kidney stone. There was some bacteria but it was not a good specimen and I think it is somewhat unlikely to actually have a urinary tract infection. However, he will be given a short course of antibiotics to see if it helps. Return if pain is getting worse or if you start running a high fever.  Flank Pain Flank pain refers to pain that is located on the side of the body between the upper abdomen and the back. The pain may occur over a short period of time (acute) or may be long-term or reoccurring (chronic). It may be mild or severe. Flank pain can be caused by many things. CAUSES  Some of the more common causes of flank pain include:  Muscle strains.   Muscle spasms.   A disease of your spine (vertebral disk disease).   A lung infection (pneumonia).   Fluid around your lungs (pulmonary edema).   A kidney infection.   Kidney stones.   A very painful skin rash caused by the chickenpox virus (shingles).   Gallbladder disease.  HOME CARE INSTRUCTIONS  Home care will depend on the cause of your pain. In general,  Rest as directed by your caregiver.  Drink enough fluids to keep your urine clear or pale yellow.  Only take over-the-counter or prescription medicines as directed by your caregiver. Some medicines may help relieve the pain.  Tell your caregiver about any changes in your pain.  Follow up with your caregiver as directed. SEEK IMMEDIATE MEDICAL CARE IF:   Your pain is not controlled with medicine.   You have new or worsening symptoms.  Your pain increases.   You have abdominal pain.   You have shortness of breath.   You have persistent nausea or vomiting.   You have swelling in your abdomen.   You feel faint or pass out.   You have blood in your urine.  You have a fever or persistent symptoms for more than 2-3 days.  You have a fever and your symptoms suddenly get  worse. MAKE SURE YOU:   Understand these instructions.  Will watch your condition.  Will get help right away if you are not doing well or get worse. Document Released: 08/02/2005 Document Revised: 03/05/2012 Document Reviewed: 01/24/2012 [facility] Patient Information 2015 [place], [place]. This information is not intended to replace advice given to you by your health care provider. Make sure you discuss any questions you have with your health care provider.  Nitrofurantoin tablets or capsules What is this medicine? NITROFURANTOIN (nye troe fyoor AN toyn) is an antibiotic. It is used to treat urinary tract infections. This medicine may be used for other purposes; ask your health care provider or pharmacist if you have questions. COMMON BRAND NAME(S): Macrobid, Macrodantin, Urotoin What should I tell my health care provider before I take this medicine? They need to know if you have any of these conditions: -anemia -diabetes -glucose-6-phosphate dehydrogenase deficiency -kidney disease -liver disease -lung disease -other chronic illness -an unusual or allergic reaction to nitrofurantoin, other antibiotics, other medicines, foods, dyes or preservatives -pregnant or trying to get pregnant -breast-feeding How should I use this medicine? Take this medicine by mouth with a glass of water. Follow the directions on the prescription label. Take this medicine with food or milk. Take your doses at regular intervals. Do not take your medicine more often than directed. Do not stop taking except on  your doctor's advice. Talk to your pediatrician regarding the use of this medicine in children. While this drug may be prescribed for selected conditions, precautions do apply. Overdosage: If you think you have taken too much of this medicine contact a poison control center or emergency room at once. NOTE: This medicine is only for you. Do not share this medicine with others. What if I miss a dose? If you  miss a dose, take it as soon as you can. If it is almost time for your next dose, take only that dose. Do not take double or extra doses. What may interact with this medicine? -antacids containing magnesium trisilicate -probenecid -quinolone antibiotics like ciprofloxacin, lomefloxacin, norfloxacin and ofloxacin -sulfinpyrazone This list may not describe all possible interactions. Give your health care provider a list of all the medicines, herbs, non-prescription drugs, or dietary supplements you use. Also tell them if you smoke, drink alcohol, or use illegal drugs. Some items may interact with your medicine. What should I watch for while using this medicine? Tell your doctor or health care professional if your symptoms do not improve or if you get new symptoms. Drink several glasses of water a day. If you are taking this medicine for a long time, visit your doctor for regular checks on your progress. If you are diabetic, you may get a false positive result for sugar in your urine with certain brands of urine tests. Check with your doctor. What side effects may I notice from receiving this medicine? Side effects that you should report to your doctor or health care professional as soon as possible: -allergic reactions like skin rash or hives, swelling of the face, lips, or tongue -chest pain -cough -difficulty breathing -dizziness, drowsiness -fever or infection -joint aches or pains -pale or blue-tinted skin -redness, blistering, peeling or loosening of the skin, including inside the mouth -tingling, burning, pain, or numbness in hands or feet -unusual bleeding or bruising -unusually weak or tired -yellowing of eyes or skin Side effects that usually do not require medical attention (report to your doctor or health care professional if they continue or are bothersome): -dark urine -diarrhea -headache -loss of appetite -nausea or vomiting -temporary hair loss This list may not describe  all possible side effects. Call your doctor for medical advice about side effects. You may report side effects to FDA at 1-800-FDA-1088. Where should I keep my medicine? Keep out of the reach of children. Store at room temperature between 15 and 30 degrees C (59 and 86 degrees F). Protect from light. Throw away any unused medicine after the expiration date. NOTE: This sheet is a summary. It may not cover all possible information. If you have questions about this medicine, talk to your doctor, pharmacist, or health care provider.  2015, Elsevier/Gold Standard. (2007-12-31 15:56:47)  Naproxen and naproxen sodium oral immediate-release tablets What is this medicine? NAPROXEN (na PROX en) is a non-steroidal anti-inflammatory drug (NSAID). It is used to reduce swelling and to treat pain. This medicine may be used for dental pain, headache, or painful monthly periods. It is also used for painful joint and muscular problems such as arthritis, tendinitis, bursitis, and gout. This medicine may be used for other purposes; ask your health care provider or pharmacist if you have questions. COMMON BRAND NAME(S): Aflaxen, Aleve, Aleve Arthritis, All Day Relief, Anaprox, Anaprox DS, Naprosyn What should I tell my health care provider before I take this medicine? They need to know if you have any of these conditions: -asthma -  cigarette smoker -drink more than 3 alcohol containing drinks a day -heart disease or circulation problems such as heart failure or leg edema (fluid retention) -high blood pressure -kidney disease -liver disease -stomach bleeding or ulcers -an unusual or allergic reaction to naproxen, aspirin, other NSAIDs, other medicines, foods, dyes, or preservatives -pregnant or trying to get pregnant -breast-feeding How should I use this medicine? Take this medicine by mouth with a glass of water. Follow the directions on the prescription label. Take it with food if your stomach gets upset. Try  to not lie down for at least 10 minutes after you take it. Take your medicine at regular intervals. Do not take your medicine more often than directed. Long-term, continuous use may increase the risk of heart attack or stroke. A special MedGuide will be given to you by the pharmacist with each prescription and refill. Be sure to read this information carefully each time. Talk to your pediatrician regarding the use of this medicine in children. Special care may be needed. Overdosage: If you think you have taken too much of this medicine contact a poison control center or emergency room at once. NOTE: This medicine is only for you. Do not share this medicine with others. What if I miss a dose? If you miss a dose, take it as soon as you can. If it is almost time for your next dose, take only that dose. Do not take double or extra doses. What may interact with this medicine? -alcohol -aspirin -cidofovir -diuretics -lithium -methotrexate -other drugs for inflammation like ketorolac or prednisone -pemetrexed -probenecid -warfarin This list may not describe all possible interactions. Give your health care provider a list of all the medicines, herbs, non-prescription drugs, or dietary supplements you use. Also tell them if you smoke, drink alcohol, or use illegal drugs. Some items may interact with your medicine. What should I watch for while using this medicine? Tell your doctor or health care professional if your pain does not get better. Talk to your doctor before taking another medicine for pain. Do not treat yourself. This medicine does not prevent heart attack or stroke. In fact, this medicine may increase the chance of a heart attack or stroke. The chance may increase with longer use of this medicine and in people who have heart disease. If you take aspirin to prevent heart attack or stroke, talk with your doctor or health care professional. Do not take other medicines that contain aspirin,  ibuprofen, or naproxen with this medicine. Side effects such as stomach upset, nausea, or ulcers may be more likely to occur. Many medicines available without a prescription should not be taken with this medicine. This medicine can cause ulcers and bleeding in the stomach and intestines at any time during treatment. Do not smoke cigarettes or drink alcohol. These increase irritation to your stomach and can make it more susceptible to damage from this medicine. Ulcers and bleeding can happen without warning symptoms and can cause death. You may get drowsy or dizzy. Do not drive, use machinery, or do anything that needs mental alertness until you know how this medicine affects you. Do not stand or sit up quickly, especially if you are an older patient. This reduces the risk of dizzy or fainting spells. This medicine can cause you to bleed more easily. Try to avoid damage to your teeth and gums when you brush or floss your teeth. What side effects may I notice from receiving this medicine? Side effects that you should report to  your doctor or health care professional as soon as possible: -black or bloody stools, blood in the urine or vomit -blurred vision -chest pain -difficulty breathing or wheezing -nausea or vomiting -severe stomach pain -skin rash, skin redness, blistering or peeling skin, hives, or itching -slurred speech or weakness on one side of the body -swelling of eyelids, throat, lips -unexplained weight gain or swelling -unusually weak or tired -yellowing of eyes or skin Side effects that usually do not require medical attention (report to your doctor or health care professional if they continue or are bothersome): -constipation -headache -heartburn This list may not describe all possible side effects. Call your doctor for medical advice about side effects. You may report side effects to FDA at 1-800-FDA-1088. Where should I keep my medicine? Keep out of the reach of  children. Store at room temperature between 15 and 30 degrees C (59 and 86 degrees F). Keep container tightly closed. Throw away any unused medicine after the expiration date. NOTE: This sheet is a summary. It may not cover all possible information. If you have questions about this medicine, talk to your doctor, pharmacist, or health care provider.  2015, Elsevier/Gold Standard. (2009-06-13 20:10:16)  Acetaminophen; Oxycodone tablets What is this medicine? ACETAMINOPHEN; OXYCODONE (a set a MEE noe fen; ox i KOE done) is a pain reliever. It is used to treat mild to moderate pain. This medicine may be used for other purposes; ask your health care provider or pharmacist if you have questions. COMMON BRAND NAME(S): Endocet, Magnacet, Narvox, Percocet, Perloxx, Primalev, Primlev, Roxicet, Xolox What should I tell my health care provider before I take this medicine? They need to know if you have any of these conditions: -brain tumor -Crohn's disease, inflammatory bowel disease, or ulcerative colitis -drug abuse or addiction -head injury -heart or circulation problems -if you often drink alcohol -kidney disease or problems going to the bathroom -liver disease -lung disease, asthma, or breathing problems -an unusual or allergic reaction to acetaminophen, oxycodone, other opioid analgesics, other medicines, foods, dyes, or preservatives -pregnant or trying to get pregnant -breast-feeding How should I use this medicine? Take this medicine by mouth with a full glass of water. Follow the directions on the prescription label. Take your medicine at regular intervals. Do not take your medicine more often than directed. Talk to your pediatrician regarding the use of this medicine in children. Special care may be needed. Patients over 668 years old may have a stronger reaction and need a smaller dose. Overdosage: If you think you have taken too much of this medicine contact a poison control center or  emergency room at once. NOTE: This medicine is only for you. Do not share this medicine with others. What if I miss a dose? If you miss a dose, take it as soon as you can. If it is almost time for your next dose, take only that dose. Do not take double or extra doses. What may interact with this medicine? -alcohol -antihistamines -barbiturates like amobarbital, butalbital, butabarbital, methohexital, pentobarbital, phenobarbital, thiopental, and secobarbital -benztropine -drugs for bladder problems like solifenacin, trospium, oxybutynin, tolterodine, hyoscyamine, and methscopolamine -drugs for breathing problems like ipratropium and tiotropium -drugs for certain stomach or intestine problems like propantheline, homatropine methylbromide, glycopyrrolate, atropine, belladonna, and dicyclomine -general anesthetics like etomidate, ketamine, nitrous oxide, propofol, desflurane, enflurane, halothane, isoflurane, and sevoflurane -medicines for depression, anxiety, or psychotic disturbances -medicines for sleep -muscle relaxants -naltrexone -narcotic medicines (opiates) for pain -phenothiazines like perphenazine, thioridazine, chlorpromazine, mesoridazine, fluphenazine, prochlorperazine, promazine,  and trifluoperazine -scopolamine -tramadol -trihexyphenidyl This list may not describe all possible interactions. Give your health care provider a list of all the medicines, herbs, non-prescription drugs, or dietary supplements you use. Also tell them if you smoke, drink alcohol, or use illegal drugs. Some items may interact with your medicine. What should I watch for while using this medicine? Tell your doctor or health care professional if your pain does not go away, if it gets worse, or if you have new or a different type of pain. You may develop tolerance to the medicine. Tolerance means that you will need a higher dose of the medication for pain relief. Tolerance is normal and is expected if you take  this medicine for a long time. Do not suddenly stop taking your medicine because you may develop a severe reaction. Your body becomes used to the medicine. This does NOT mean you are addicted. Addiction is a behavior related to getting and using a drug for a non-medical reason. If you have pain, you have a medical reason to take pain medicine. Your doctor will tell you how much medicine to take. If your doctor wants you to stop the medicine, the dose will be slowly lowered over time to avoid any side effects. You may get drowsy or dizzy. Do not drive, use machinery, or do anything that needs mental alertness until you know how this medicine affects you. Do not stand or sit up quickly, especially if you are an older patient. This reduces the risk of dizzy or fainting spells. Alcohol may interfere with the effect of this medicine. Avoid alcoholic drinks. There are different types of narcotic medicines (opiates) for pain. If you take more than one type at the same time, you may have more side effects. Give your health care provider a list of all medicines you use. Your doctor will tell you how much medicine to take. Do not take more medicine than directed. Call emergency for help if you have problems breathing. The medicine will cause constipation. Try to have a bowel movement at least every 2 to 3 days. If you do not have a bowel movement for 3 days, call your doctor or health care professional. Do not take Tylenol (acetaminophen) or medicines that have acetaminophen with this medicine. Too much acetaminophen can be very dangerous. Many nonprescription medicines contain acetaminophen. Always read the labels carefully to avoid taking more acetaminophen. What side effects may I notice from receiving this medicine? Side effects that you should report to your doctor or health care professional as soon as possible: -allergic reactions like skin rash, itching or hives, swelling of the face, lips, or  tongue -breathing difficulties, wheezing -confusion -light headedness or fainting spells -severe stomach pain -unusually weak or tired -yellowing of the skin or the whites of the eyes Side effects that usually do not require medical attention (report to your doctor or health care professional if they continue or are bothersome): -dizziness -drowsiness -nausea -vomiting This list may not describe all possible side effects. Call your doctor for medical advice about side effects. You may report side effects to FDA at 1-800-FDA-1088. Where should I keep my medicine? Keep out of the reach of children. This medicine can be abused. Keep your medicine in a safe place to protect it from theft. Do not share this medicine with anyone. Selling or giving away this medicine is dangerous and against the law. Store at room temperature between 20 and 25 degrees C (68 and 77 degrees F). Keep  container tightly closed. Protect from light. This medicine may cause accidental overdose and death if it is taken by other adults, children, or pets. Flush any unused medicine down the toilet to reduce the chance of harm. Do not use the medicine after the expiration date. NOTE: This sheet is a summary. It may not cover all possible information. If you have questions about this medicine, talk to your doctor, pharmacist, or health care provider.  2015, Elsevier/Gold Standard. (2013-02-02 13:17:35)

## 2014-12-21 ENCOUNTER — Encounter (HOSPITAL_COMMUNITY): Payer: Self-pay | Admitting: Emergency Medicine

## 2014-12-21 ENCOUNTER — Emergency Department (HOSPITAL_COMMUNITY)
Admission: EM | Admit: 2014-12-21 | Discharge: 2014-12-21 | Disposition: A | Discharge status: Home / Self Care | Payer: Self-pay | Attending: Emergency Medicine | Admitting: Emergency Medicine

## 2014-12-21 ENCOUNTER — Emergency Department (HOSPITAL_COMMUNITY): Payer: Self-pay

## 2014-12-21 DIAGNOSIS — Z87440 Personal history of urinary (tract) infections: Secondary | ICD-10-CM | POA: Insufficient documentation

## 2014-12-21 DIAGNOSIS — Z79899 Other long term (current) drug therapy: Secondary | ICD-10-CM | POA: Insufficient documentation

## 2014-12-21 DIAGNOSIS — Z791 Long term (current) use of non-steroidal anti-inflammatories (NSAID): Secondary | ICD-10-CM | POA: Insufficient documentation

## 2014-12-21 DIAGNOSIS — Z8614 Personal history of Methicillin resistant Staphylococcus aureus infection: Secondary | ICD-10-CM | POA: Insufficient documentation

## 2014-12-21 DIAGNOSIS — Z720 Tobacco use: Secondary | ICD-10-CM | POA: Insufficient documentation

## 2014-12-21 DIAGNOSIS — J40 Bronchitis, not specified as acute or chronic: Secondary | ICD-10-CM | POA: Insufficient documentation

## 2014-12-21 DIAGNOSIS — Z87442 Personal history of urinary calculi: Secondary | ICD-10-CM | POA: Insufficient documentation

## 2014-12-21 DIAGNOSIS — M419 Scoliosis, unspecified: Secondary | ICD-10-CM | POA: Insufficient documentation

## 2014-12-21 MED ORDER — PREDNISONE 20 MG PO TABS: 60.0000 mg | ORAL_TABLET | Freq: Every day | ORAL | Status: DC

## 2014-12-21 MED ORDER — ALBUTEROL SULFATE HFA 108 (90 BASE) MCG/ACT IN AERS: 2.0000 | INHALATION_SPRAY | RESPIRATORY_TRACT | Status: DC | PRN

## 2014-12-21 MED ORDER — IPRATROPIUM-ALBUTEROL 0.5-2.5 (3) MG/3ML IN SOLN
3.0000 mL | Freq: Once | RESPIRATORY_TRACT | Status: AC
  Administered 2014-12-21: 3 mL via RESPIRATORY_TRACT
  Filled 2014-12-21: qty 3

## 2014-12-21 MED ORDER — ALBUTEROL SULFATE HFA 108 (90 BASE) MCG/ACT IN AERS
2.0000 | INHALATION_SPRAY | Freq: Once | RESPIRATORY_TRACT | Status: AC
  Administered 2014-12-21: 2 via RESPIRATORY_TRACT
  Filled 2014-12-21: qty 6.7

## 2014-12-21 MED ORDER — PREDNISONE 50 MG PO TABS
60.0000 mg | ORAL_TABLET | Freq: Once | ORAL | Status: AC
  Administered 2014-12-21: 60 mg via ORAL
  Filled 2014-12-21 (×2): qty 1

## 2014-12-21 NOTE — ED Provider Notes (Signed)
CSN: [id number]     Arrival date & time 12/21/14  0146 History   First MD Initiated Contact with Patient 12/21/14 [phone]     Chief Complaint  Patient presents with  . Shortness of Breath     (Consider location/radiation/quality/duration/timing/severity/associated sxs/prior Treatment) HPI  This is a 39 year old female who presents with shortness of breath, cough, and nasal congestion. Patient reports several days of worsening nasal congestion, nonproductive cough, and wheezing. Patient reports prior to arrival she had an episode where she had a hard time "catching my breath." She denies any fevers. She is a current smoker. Also reports sore throat. Denies any chest pain.  Past Medical History  Diagnosis Date  . Kidney stone   . Scoliosis   . Urinary tract infection   . MRSA (methicillin resistant staph aureus) culture positive    Past Surgical History  Procedure Laterality Date  . Kidney stone surgery    . Cystectomy     History reviewed. No pertinent family history. History  Substance Use Topics  . Smoking status: Current Every Day Smoker    Types: Cigarettes  . Smokeless tobacco: Not on file  . Alcohol Use: No   OB History    No data available     Review of Systems  Constitutional: Negative for fever.  HENT: Positive for congestion and sore throat.   Respiratory: Positive for cough, shortness of breath and wheezing. Negative for chest tightness.   Cardiovascular: Negative for chest pain and leg swelling.  Gastrointestinal: Negative for nausea, vomiting and abdominal pain.  Genitourinary: Negative for dysuria.  Musculoskeletal: Negative for back pain.  Neurological: Negative for headaches.  All other systems reviewed and are negative.     Allergies  Review of patient's allergies indicates no known allergies.  Home Medications   Prior to Admission medications   Medication Sig Start Date End Date Taking? Authorizing Provider  albuterol (PROVENTIL HFA;VENTOLIN  HFA) 108 (90 BASE) MCG/ACT inhaler Inhale 2 puffs into the lungs every 4 (four) hours as needed for wheezing or shortness of breath. 12/21/14   Shon Baton, MD  ferrous sulfate 325 (65 FE) MG tablet Take 325 mg by mouth daily with breakfast.    Historical Provider, MD  ibuprofen (ADVIL,MOTRIN) 600 MG tablet Take 1 tablet (600 mg total) by mouth every 6 (six) hours as needed. 06/08/13   Burgess Amor, PA-C  naproxen (NAPROSYN) 500 MG tablet Take 1 tablet (500 mg total) by mouth 2 (two) times daily. 12/17/13   Dione Booze, MD  nitrofurantoin, macrocrystal-monohydrate, (MACROBID) 100 MG capsule Take 1 capsule (100 mg total) by mouth 2 (two) times daily. 12/17/13   Dione Booze, MD  norethindrone-ethinyl estradiol (NECON,BREVICON,MODICON) 0.5-35 MG-MCG tablet Take 1 tablet by mouth daily.    Historical Provider, MD  oxyCODONE-acetaminophen (PERCOCET) 5-325 MG per tablet Take 1 tablet by mouth every 4 (four) hours as needed for moderate pain. 12/17/13   Dione Booze, MD  predniSONE (DELTASONE) 20 MG tablet Take 3 tablets (60 mg total) by mouth daily with breakfast. 12/21/14   Shon Baton, MD  traMADol (ULTRAM) 50 MG tablet Take 1 tablet (50 mg total) by mouth every 6 (six) hours as needed. 06/08/13   Burgess Amor, PA-C   BP 166/99 mmHg  Pulse 92  Temp(Src) 98 F (36.7 C)  Resp 20  Ht  (1.702 m)  Wt 195 lb (88.451 kg)  BMI 30.53 kg/m2  SpO2 97%  LMP 11/28/2014 Physical Exam  Constitutional: She is oriented to  person, place, and time. She appears well-developed and well-nourished. No distress.  HENT:  Head: Normocephalic and atraumatic.  Cardiovascular: Normal rate, regular rhythm and normal heart sounds.   No murmur heard. Pulmonary/Chest: Effort normal. No respiratory distress. She has wheezes.  Fair air movement, scattered wheezing  Abdominal: Soft. Bowel sounds are normal. There is no tenderness. There is no rebound.  Neurological: She is alert and oriented to person, place, and time.   Skin: Skin is warm and dry.  Psychiatric: She has a normal mood and affect.  Nursing note and vitals reviewed.   ED Course  Procedures (including critical care time) Labs Review Labs Reviewed - No data to display  Imaging Review Dg Chest 2 View  12/21/2014   CLINICAL DATA:  Dyspnea, nonproductive cough and nasal congestion for several days.  EXAM: CHEST  2 VIEW  COMPARISON:  None.  FINDINGS: The heart size and mediastinal contours are within normal limits. Both lungs are clear. The visualized skeletal structures are unremarkable.  IMPRESSION: No active cardiopulmonary disease.   Electronically Signed   By: Ellery Plunkaniel R Mitchell M.D.   On: 12/21/2014 03:31     EKG Interpretation None      MDM   Final diagnoses:  Bronchitis   Patient presents with cough, shortness of breath, and wheezing. Nontoxic on exam. Had already received an albuterol treatment prior to my assessment. Reports improvement. She continues to have some scattered wheezing but with good air movement.  Chest x-ray pending. Patient given HFA inhaler and prednisone. Suspect acute bronchitis.  After history, exam, and medical workup I feel the patient has been appropriately medically screened and is safe for discharge home. Pertinent diagnoses were discussed with the patient. Patient was given return precautions.   Shon Batonourtney F Horton, MD 12/21/14 [phone]

## 2014-12-21 NOTE — ED Notes (Signed)
   12/21/14 0200  Respiratory  Respiratory (WDL) X  Bilateral Breath Sounds Expiratory wheezes  Respiratory Pattern Regular  Chest Assessment Chest expansion symmetrical  O2 Device Room Air  pt presents ww/ SOB and non-productive cough for 3 days.

## 2014-12-21 NOTE — ED Notes (Signed)
Pt alert & oriented x4, stable gait. Patient  given discharge instructions, paperwork & prescription(s). Patient verbalized understanding. Pt left department w/ no further questions. 

## 2014-12-21 NOTE — Discharge Instructions (Signed)

## 2014-12-21 NOTE — ED Notes (Signed)
Pt c/o sob, non productive cough, and nasal congestion.

## 2015-03-18 ENCOUNTER — Emergency Department (HOSPITAL_COMMUNITY)
Admission: EM | Admit: 2015-03-18 | Discharge: 2015-03-18 | Disposition: A | Discharge status: Home / Self Care | Payer: BLUE CROSS/BLUE SHIELD | Attending: Emergency Medicine | Admitting: Emergency Medicine

## 2015-03-18 ENCOUNTER — Encounter (HOSPITAL_COMMUNITY): Payer: Self-pay | Admitting: *Deleted

## 2015-03-18 DIAGNOSIS — Z23 Encounter for immunization: Secondary | ICD-10-CM | POA: Insufficient documentation

## 2015-03-18 DIAGNOSIS — Z79899 Other long term (current) drug therapy: Secondary | ICD-10-CM | POA: Diagnosis not present

## 2015-03-18 DIAGNOSIS — Z87440 Personal history of urinary (tract) infections: Secondary | ICD-10-CM | POA: Diagnosis not present

## 2015-03-18 DIAGNOSIS — Z8739 Personal history of other diseases of the musculoskeletal system and connective tissue: Secondary | ICD-10-CM | POA: Insufficient documentation

## 2015-03-18 DIAGNOSIS — Z203 Contact with and (suspected) exposure to rabies: Secondary | ICD-10-CM

## 2015-03-18 DIAGNOSIS — Z8614 Personal history of Methicillin resistant Staphylococcus aureus infection: Secondary | ICD-10-CM | POA: Insufficient documentation

## 2015-03-18 DIAGNOSIS — Z720 Tobacco use: Secondary | ICD-10-CM | POA: Insufficient documentation

## 2015-03-18 DIAGNOSIS — Z87442 Personal history of urinary calculi: Secondary | ICD-10-CM | POA: Insufficient documentation

## 2015-03-18 MED ORDER — RABIES VACCINE, PCEC IM SUSR
1.0000 mL | Freq: Once | INTRAMUSCULAR | Status: AC
  Administered 2015-03-18: 1 mL via INTRAMUSCULAR
  Filled 2015-03-18: qty 1

## 2015-03-18 MED ORDER — RABIES IMMUNE GLOBULIN 150 UNIT/ML IM INJ
20.0000 [IU]/kg | INJECTION | Freq: Once | INTRAMUSCULAR | Status: AC
  Administered 2015-03-18: 1800 [IU] via INTRAMUSCULAR
  Filled 2015-03-18 (×2): qty 12

## 2015-03-18 NOTE — ED Provider Notes (Signed)
CSN: [id number]     Arrival date & time 03/18/15  0941 History   First MD Initiated Contact with Patient 03/18/15 1010     Chief Complaint  Patient presents with  . Rabies exposure      (Consider location/radiation/quality/duration/timing/severity/associated sxs/prior Treatment) HPI   Kathleen Perez is a 39 y.o. female who presents to the Emergency Department requesting rabies vaccinations for a possible exposure to a bat.  She state that a bat flew into her house last evening that she was unaware of, and she states that she and her daughter slept in the same room where the bat was.  She denies known bite.  She states that her husband came home from work this morning and saw the bat.  Animal control was contacted and the bat was killed and will be tested.  She states that she was advised to have rabies vaccinations due to the exposure.  She denies any symptoms at present.    Past Medical History  Diagnosis Date  . Kidney stone   . Scoliosis   . Urinary tract infection   . MRSA (methicillin resistant staph aureus) culture positive    Past Surgical History  Procedure Laterality Date  . Kidney stone surgery    . Cystectomy     No family history on file. Social History  Substance Use Topics  . Smoking status: Current Every Day Smoker    Types: Cigarettes  . Smokeless tobacco: None  . Alcohol Use: No   OB History    No data available     Review of Systems  Constitutional: Negative for fever, chills and fatigue.  HENT: Negative for sore throat and trouble swallowing.   Respiratory: Negative for cough and shortness of breath.   Cardiovascular: Negative for chest pain.  Gastrointestinal: Negative for nausea, vomiting and abdominal pain.  Musculoskeletal: Negative for myalgias and arthralgias.  Skin: Negative for color change and rash.  Neurological: Negative for dizziness, weakness and numbness.  All other systems reviewed and are negative.     Allergies  Review of  patient's allergies indicates no known allergies.  Home Medications   Prior to Admission medications   Medication Sig Start Date End Date Taking? Authorizing Provider  atenolol (TENORMIN) 50 MG tablet Take 50 mg by mouth daily.   Yes Historical Provider, MD  ferrous sulfate 325 (65 FE) MG tablet Take 325 mg by mouth daily with breakfast.   Yes Historical Provider, MD  albuterol (PROVENTIL HFA;VENTOLIN HFA) 108 (90 BASE) MCG/ACT inhaler Inhale 2 puffs into the lungs every 4 (four) hours as needed for wheezing or shortness of breath. 12/21/14   Shon Baton, MD  ibuprofen (ADVIL,MOTRIN) 600 MG tablet Take 1 tablet (600 mg total) by mouth every 6 (six) hours as needed. Patient not taking: Reported on 03/18/2015 06/08/13   Burgess Amor, PA-C  naproxen (NAPROSYN) 500 MG tablet Take 1 tablet (500 mg total) by mouth 2 (two) times daily. Patient not taking: Reported on 03/18/2015 12/17/13   Dione Booze, MD  nitrofurantoin, macrocrystal-monohydrate, (MACROBID) 100 MG capsule Take 1 capsule (100 mg total) by mouth 2 (two) times daily. Patient not taking: Reported on 03/18/2015 12/17/13   Dione Booze, MD  oxyCODONE-acetaminophen (PERCOCET) 5-325 MG per tablet Take 1 tablet by mouth every 4 (four) hours as needed for moderate pain. Patient not taking: Reported on 03/18/2015 12/17/13   Dione Booze, MD  predniSONE (DELTASONE) 20 MG tablet Take 3 tablets (60 mg total) by mouth daily with breakfast.  Patient not taking: Reported on 03/18/2015 12/21/14   Shon Baton, MD  traMADol (ULTRAM) 50 MG tablet Take 1 tablet (50 mg total) by mouth every 6 (six) hours as needed. Patient not taking: Reported on 03/18/2015 06/08/13   Burgess Amor, PA-C   BP 149/91 mmHg  Pulse 86  Temp(Src) 98.1 F (36.7 C) (Oral)  Resp 20  Ht  (1.702 m)  Wt 201 lb 14.4 oz (91.581 kg)  BMI 31.61 kg/m2  SpO2 99%  LMP 03/16/2015 Physical Exam  Constitutional: She is oriented to person, place, and time. She appears well-developed and  well-nourished. No distress.  HENT:  Head: Atraumatic.  Mouth/Throat: Oropharynx is clear and moist.  Neck: Normal range of motion. Neck supple.  Cardiovascular: Normal rate, regular rhythm and normal heart sounds.   Pulmonary/Chest: Effort normal and breath sounds normal. No respiratory distress.  Musculoskeletal: Normal range of motion. She exhibits no tenderness.  Lymphadenopathy:    She has no cervical adenopathy.  Neurological: She is alert and oriented to person, place, and time. She exhibits normal muscle tone. Coordination normal.  Skin: Skin is warm and dry.  Psychiatric: She has a normal mood and affect.  Nursing note and vitals reviewed.   ED Course  Procedures (including critical care time) Labs Review Labs Reviewed - No data to display  Imaging Review No results found. I have personally reviewed and evaluated these images and lab results as part of my medical decision-making.   EKG Interpretation None      MDM   Final diagnoses:  Need for post exposure prophylaxis for rabies    Initial rabies vaccine and immune globulin given.  Pt understands to follow-up with [facility] for her subsequent vaccinations since her 65 yo daughter will also be receiving the vaccinations.      Pauline Aus, PA-C 03/18/15 1157  Bethann Berkshire, MD 03/18/15 1432

## 2015-03-18 NOTE — Discharge Instructions (Signed)
Rabies  Rabies is a viral infection that can be spread to people from infected animals. The infection affects the brain and central nervous system. Once the disease develops, it almost always causes death. Because of this, when a person is bitten by an animal that may have rabies, treatment to prevent rabies often needs to be started whether or not the animal is known to be infected. Prompt treatment with the rabies vaccine and rabies immune globulin is very effective at preventing the infection from developing in people who have been exposed to the rabies virus. CAUSES  Rabies is caused by a virus that lives inside some animals. When a person is bitten by an infected animal, the rabies virus is spread to the person through the infected spit (saliva) of the animal. This virus can be carried by animals such as dogs, cats, skunks, bats, woodchucks, raccoons, coyotes, and foxes. SYMPTOMS  By the time symptoms appear, rabies is usually fatal for the person. Common symptoms include:  Headache.  Fever.  Fatigue and weakness.  Agitation.  Anxiety.  Confusion.  Unusual behavior, such as hyperactivity, fear of water (hydrophobia), or fear of air (aerophobia).  Hallucinations.  Insomnia.  Weakness in the arms or legs.  Difficulty swallowing. Most people get sick in 1-3 months after being bitten. This often varies and may depend on the location of the bite. The infection will take less time to develop if the bite occurred closer to the head.  DIAGNOSIS  To determine if a person is infected, several tests must be performed, such as:  A skin biopsy.  A saliva test.  A lumbar puncture to remove spinal fluid so it can be examined.  Blood tests. TREATMENT  Treatment to prevent the infection from developing (post-exposure prophylaxis, PEP) is often started before knowing for sure if the person has been exposed to the rabies virus. PEP involves cleaning the wound, giving an antibody injection  (rabies immune globulin), and giving a series of rabies vaccine injections. The series of injections are usually given over a two-week period. If possible, the animal that bit the person will be observed to see if it remains healthy. If the animal has been killed, it can be sent to a state laboratory and examined to see if the animal had rabies. If a person is bitten by a domestic animal (dog, cat, or ferret) that appears healthy and can be observed to see if it remains healthy, often no further treatment is necessary other than care of the wounds caused by the animal. Rabies is often a fatal illness once the infection develops in a person. Although a few people who developed rabies have survived after experimental treatment with certain drugs, all these survivors still had severe nervous system problems after the treatment. This is why caregivers use extra caution and begin PEP treatment for people who have been bitten by animals that are possibly infected with rabies.  HOME CARE INSTRUCTIONS  If you were bitten by an unknown animal, make sure you know your caregiver's instructions for follow-up. If the animal was sent to a laboratory for examination, ask when the test results will be ready. Make sure you get the test results.  Take these steps to care for your wound:  Keep the wound clean, dry, and dressed as directed by your caregiver.  Keep the injured part elevated as much as possible.  Do not resume use of the affected area until directed.  Only take over-the-counter or prescription medicines as directed by   your caregiver.  Keep all follow-up appointments as directed by your caregiver. PREVENTION  To prevent rabies, people need to reduce their risk of having contact with infected animals.   Make sure your pets (dogs, cats, ferrets) are vaccinated against rabies. Keep these vaccinations up-to-date as directed by your veterinarian.  Supervise your pets when they are outside. Keep them away  from wild animals.  Call your local animal control services to report any stray animals. These animals may not be vaccinated.  Stay away from stray or wild animals.  Consider getting the rabies vaccine (preexposure) if you are traveling to an area where rabies is common or if your job or activities involve possible contact with wild or stray animals. Discuss this with your caregiver. Document Released: 06/11/2005 Document Revised: 03/05/2012 Document Reviewed: 01/08/2012 Exit[employer] Patient Information 2015 [employer]. This information is not intended to replace advice given to you by your health care provider. Make sure you discuss any questions you have with your health care provider.  

## 2015-03-18 NOTE — ED Notes (Signed)
Bat few into house and did not realize that the bat was in the bedroom all night.

## 2015-03-18 NOTE — ED Notes (Signed)
Pt's reports that a bat flew in the house last night when she opened the door to go outside. Pt slept in the same bed as her daughter last night and when they woke up the bat was in the room flying around. Pt reports she doesn't know if they got bit or not.

## 2015-03-21 ENCOUNTER — Encounter (HOSPITAL_COMMUNITY): Payer: Self-pay

## 2015-03-21 ENCOUNTER — Emergency Department (INDEPENDENT_AMBULATORY_CARE_PROVIDER_SITE_OTHER)
Admission: EM | Admit: 2015-03-21 | Discharge: 2015-03-21 | Disposition: A | Discharge status: Home / Self Care | Payer: BLUE CROSS/BLUE SHIELD | Source: Home / Self Care

## 2015-03-21 DIAGNOSIS — Z203 Contact with and (suspected) exposure to rabies: Secondary | ICD-10-CM | POA: Diagnosis not present

## 2015-03-21 MED ORDER — RABIES VACCINE, PCEC IM SUSR
1.0000 mL | Freq: Once | INTRAMUSCULAR | Status: AC
  Administered 2015-03-21: 1 mL via INTRAMUSCULAR

## 2015-03-21 MED ORDER — RABIES VACCINE, PCEC IM SUSR
INTRAMUSCULAR | Status: AC
  Filled 2015-03-21: qty 1

## 2015-03-21 NOTE — ED Notes (Signed)
Rabies series day #3, shot #2. NAD, no c/o

## 2015-03-21 NOTE — ED Notes (Signed)
Incorrect date for Rx expiration entered, correct date 09-2017

## 2015-03-25 ENCOUNTER — Encounter (HOSPITAL_COMMUNITY): Payer: Self-pay

## 2015-03-25 ENCOUNTER — Emergency Department (INDEPENDENT_AMBULATORY_CARE_PROVIDER_SITE_OTHER)
Admission: EM | Admit: 2015-03-25 | Discharge: 2015-03-25 | Disposition: A | Discharge status: Home / Self Care | Payer: BLUE CROSS/BLUE SHIELD | Source: Home / Self Care

## 2015-03-25 DIAGNOSIS — Z203 Contact with and (suspected) exposure to rabies: Secondary | ICD-10-CM | POA: Diagnosis not present

## 2015-03-25 MED ORDER — RABIES VACCINE, PCEC IM SUSR
INTRAMUSCULAR | Status: AC
  Filled 2015-03-25: qty 1

## 2015-03-25 MED ORDER — RABIES VACCINE, PCEC IM SUSR
1.0000 mL | Freq: Once | INTRAMUSCULAR | Status: AC
  Administered 2015-03-25: 1 mL via INTRAMUSCULAR

## 2015-03-25 NOTE — ED Notes (Signed)
Shot #3, day #7 of rabies series; no c/o

## 2015-04-01 ENCOUNTER — Emergency Department (INDEPENDENT_AMBULATORY_CARE_PROVIDER_SITE_OTHER)
Admission: EM | Admit: 2015-04-01 | Discharge: 2015-04-01 | Disposition: A | Discharge status: Home / Self Care | Payer: BLUE CROSS/BLUE SHIELD | Source: Home / Self Care

## 2015-04-01 ENCOUNTER — Encounter (HOSPITAL_COMMUNITY): Payer: Self-pay | Admitting: Emergency Medicine

## 2015-04-01 DIAGNOSIS — Z203 Contact with and (suspected) exposure to rabies: Secondary | ICD-10-CM

## 2015-04-01 MED ORDER — RABIES VACCINE, PCEC IM SUSR
INTRAMUSCULAR | Status: AC
  Filled 2015-04-01: qty 1

## 2015-04-01 MED ORDER — RABIES VACCINE, PCEC IM SUSR
1.0000 mL | Freq: Once | INTRAMUSCULAR | Status: AC
  Administered 2015-04-01: 1 mL via INTRAMUSCULAR

## 2015-04-01 NOTE — ED Notes (Signed)
Here for rabies vaccination #4 (day 14) A&Ox4... No acute distress.

## 2015-04-30 ENCOUNTER — Emergency Department (HOSPITAL_COMMUNITY)
Admission: EM | Admit: 2015-04-30 | Discharge: 2015-04-30 | Disposition: A | Discharge status: Home / Self Care | Payer: BLUE CROSS/BLUE SHIELD | Attending: Emergency Medicine | Admitting: Emergency Medicine

## 2015-04-30 ENCOUNTER — Encounter (HOSPITAL_COMMUNITY): Payer: Self-pay | Admitting: *Deleted

## 2015-04-30 DIAGNOSIS — Z87440 Personal history of urinary (tract) infections: Secondary | ICD-10-CM | POA: Insufficient documentation

## 2015-04-30 DIAGNOSIS — K0381 Cracked tooth: Secondary | ICD-10-CM | POA: Insufficient documentation

## 2015-04-30 DIAGNOSIS — Z79899 Other long term (current) drug therapy: Secondary | ICD-10-CM | POA: Diagnosis not present

## 2015-04-30 DIAGNOSIS — Y658 Other specified misadventures during surgical and medical care: Secondary | ICD-10-CM | POA: Diagnosis not present

## 2015-04-30 DIAGNOSIS — M419 Scoliosis, unspecified: Secondary | ICD-10-CM | POA: Diagnosis not present

## 2015-04-30 DIAGNOSIS — Z720 Tobacco use: Secondary | ICD-10-CM | POA: Insufficient documentation

## 2015-04-30 DIAGNOSIS — T85848A Pain due to other internal prosthetic devices, implants and grafts, initial encounter: Secondary | ICD-10-CM | POA: Diagnosis not present

## 2015-04-30 DIAGNOSIS — M549 Dorsalgia, unspecified: Secondary | ICD-10-CM | POA: Insufficient documentation

## 2015-04-30 DIAGNOSIS — Z8614 Personal history of Methicillin resistant Staphylococcus aureus infection: Secondary | ICD-10-CM | POA: Diagnosis not present

## 2015-04-30 DIAGNOSIS — Z87442 Personal history of urinary calculi: Secondary | ICD-10-CM | POA: Diagnosis not present

## 2015-04-30 DIAGNOSIS — N946 Dysmenorrhea, unspecified: Secondary | ICD-10-CM | POA: Diagnosis not present

## 2015-04-30 MED ORDER — ACETAMINOPHEN-CODEINE #3 300-30 MG PO TABS
2.0000 | ORAL_TABLET | Freq: Once | ORAL | Status: AC
  Administered 2015-04-30: 2 via ORAL
  Filled 2015-04-30: qty 2

## 2015-04-30 MED ORDER — PROMETHAZINE HCL 12.5 MG PO TABS
12.5000 mg | ORAL_TABLET | Freq: Once | ORAL | Status: AC
  Administered 2015-04-30: 12.5 mg via ORAL
  Filled 2015-04-30: qty 1

## 2015-04-30 MED ORDER — IBUPROFEN 800 MG PO TABS
800.0000 mg | ORAL_TABLET | Freq: Once | ORAL | Status: AC
  Administered 2015-04-30: 800 mg via ORAL
  Filled 2015-04-30: qty 1

## 2015-04-30 MED ORDER — ACETAMINOPHEN-CODEINE #3 300-30 MG PO TABS: 1.0000 | ORAL_TABLET | Freq: Four times a day (QID) | ORAL | Status: DC | PRN

## 2015-04-30 MED ORDER — IBUPROFEN 600 MG PO TABS: 600.0000 mg | ORAL_TABLET | Freq: Four times a day (QID) | ORAL | Status: DC | PRN

## 2015-04-30 NOTE — ED Notes (Signed)
Pt verbalized understanding of no driving and to use caution within 4 hours of taking pain meds due to meds cause drowsiness 

## 2015-04-30 NOTE — ED Notes (Addendum)
Tooth broke off yesterday at work, unable to work today. Also states menstrual cramping which she has had since having her child. States she can usually deal with the cramping but not combined with the tooth pain.

## 2015-04-30 NOTE — ED Provider Notes (Signed)
CSN: [id number]     Arrival date & time 04/30/15  1448 History   First MD Initiated Contact with Patient 04/30/15 1521     Chief Complaint  Patient presents with  . menstrul cramps   . Dental Pain     (Consider location/radiation/quality/duration/timing/severity/associated sxs/prior Treatment) HPI Comments: Patient is a 39 year old female who presents to the emergency department with a complaint of menstrual cramps, and dental pain.  The patient states that over the past 3 years she's been having increasing problems with menstrual cramping. This was following the birth of her child. She notices increase in flow, increase in the length of her menstrual cycle, and increase in the intensity of the cramping. She states that time she is passing clots on during her menstrual flow. She states she had an ultrasound approximately 6 months after the birth of her child, but has not had a workup of this problem since that time. She denies cold with urination. She denies high fevers. His been no vomiting or other medical problems.  The patient states that she has had problems related to her dental status in the past, requiring teeth being pulled. Recently she had a tooth to crumble and break. She has an appointment with the dentist next week, but states that the dental pain along with the menstrual cramping was more than she fell she could manage. She presents now for assistance with her pain.  Patient is a 39 y.o. female presenting with tooth pain. The history is provided by the patient.  Dental Pain   Past Medical History  Diagnosis Date  . Kidney stone   . Scoliosis   . Urinary tract infection   . MRSA (methicillin resistant staph aureus) culture positive    Past Surgical History  Procedure Laterality Date  . Kidney stone surgery    . Cystectomy     No family history on file. Social History  Substance Use Topics  . Smoking status: Current Every Day Smoker    Types: Cigarettes  . Smokeless  tobacco: None  . Alcohol Use: No   OB History    No data available     Review of Systems  HENT: Positive for dental problem.   Genitourinary: Positive for menstrual problem. Negative for dysuria, urgency and vaginal discharge.  Musculoskeletal: Positive for back pain.  Skin: Negative for rash.  All other systems reviewed and are negative.     Allergies  Review of patient's allergies indicates no known allergies.  Home Medications   Prior to Admission medications   Medication Sig Start Date End Date Taking? Authorizing Provider  albuterol (PROVENTIL HFA;VENTOLIN HFA) 108 (90 BASE) MCG/ACT inhaler Inhale 2 puffs into the lungs every 4 (four) hours as needed for wheezing or shortness of breath. 12/21/14   Shon Batonourtney F Horton, MD  atenolol (TENORMIN) 50 MG tablet Take 50 mg by mouth daily.    Historical Provider, MD  ferrous sulfate 325 (65 FE) MG tablet Take 325 mg by mouth daily with breakfast.    Historical Provider, MD  ibuprofen (ADVIL,MOTRIN) 600 MG tablet Take 1 tablet (600 mg total) by mouth every 6 (six) hours as needed. Patient not taking: Reported on 03/18/2015 06/08/13   Burgess AmorJulie Idol, PA-C  naproxen (NAPROSYN) 500 MG tablet Take 1 tablet (500 mg total) by mouth 2 (two) times daily. Patient not taking: Reported on 03/18/2015 12/17/13   Dione Boozeavid Glick, MD  nitrofurantoin, macrocrystal-monohydrate, (MACROBID) 100 MG capsule Take 1 capsule (100 mg total) by mouth 2 (two) times  daily. Patient not taking: Reported on 03/18/2015 12/17/13   Dione Booze, MD  oxyCODONE-acetaminophen (PERCOCET) 5-325 MG per tablet Take 1 tablet by mouth every 4 (four) hours as needed for moderate pain. Patient not taking: Reported on 03/18/2015 12/17/13   Dione Booze, MD  predniSONE (DELTASONE) 20 MG tablet Take 3 tablets (60 mg total) by mouth daily with breakfast. Patient not taking: Reported on 03/18/2015 12/21/14   Shon Baton, MD  traMADol (ULTRAM) 50 MG tablet Take 1 tablet (50 mg total) by mouth every 6  (six) hours as needed. Patient not taking: Reported on 03/18/2015 06/08/13   Burgess Amor, PA-C   BP 172/91 mmHg  Pulse 86  Temp(Src) 98.2 F (36.8 C) (Oral)  Resp 16  Ht  (1.702 m)  Wt 210 lb (95.255 kg)  BMI 32.88 kg/m2  SpO2 98%  LMP 04/30/2015 Physical Exam  Constitutional: She is oriented to person, place, and time. She appears well-developed and well-nourished.  Non-toxic appearance.  HENT:  Head: Normocephalic.  Right Ear: Tympanic membrane and external ear normal.  Left Ear: Tympanic membrane and external ear normal.  There is a small portion of the left upper molar that has broken off related to a cavity. No visible abscess noted. The pulp  is not exposed. Airway patent.  Eyes: EOM and lids are normal. Pupils are equal, round, and reactive to light.  Neck: Normal range of motion. Neck supple. Carotid bruit is not present.  Cardiovascular: Normal rate, regular rhythm, normal heart sounds, intact distal pulses and normal pulses.   Pulmonary/Chest: Breath sounds normal. No respiratory distress.  Abdominal: Soft. Bowel sounds are normal. There is no tenderness. There is no guarding.  Musculoskeletal: Normal range of motion.  Capillary refill is less than 2 seconds.  Lymphadenopathy:       Head (right side): No submandibular adenopathy present.       Head (left side): No submandibular adenopathy present.    She has no cervical adenopathy.  Neurological: She is alert and oriented to person, place, and time. She has normal strength. No cranial nerve deficit or sensory deficit.  Skin: Skin is warm and dry.  Color is good. Skin is warm and dry.  Psychiatric: She has a normal mood and affect. Her speech is normal.  Nursing note and vitals reviewed.   ED Course  Procedures (including critical care time) Labs Review Labs Reviewed - No data to display  Imaging Review No results found. I have personally reviewed and evaluated these images and lab results as part of my  medical decision-making.   EKG Interpretation None      MDM  Vital signs within normal limits. No abscess appreciated on oral examination. No evidence for Ludwig's angina.  The vital signs are well within normal limits. The color is good. No evidence for excessive anemia related to the patient's menstrual cycle.  I have discussed this in detail with patient. I've asked her to see her primary physician, or her GYN for detailed evaluation of this problem. The patient states she has an appointment with the dentists on Monday or Tuesday of next week. The patient will be treated with ibuprofen, Tylenol codeine, and promethazine.      Final diagnoses:  None    *I have reviewed nursing notes, vital signs, and all appropriate lab and imaging results for this patient.    Ivery Quale, PA-C 04/30/15 1558  Raeford Razor, MD 04/30/15 [phone]

## 2015-04-30 NOTE — Discharge Instructions (Signed)
Dysmenorrhea PLEASE SEE YOUR PRIMARY MD OR YOUR GYN FOR DETAIL EVALUATION OF THE CHANGES IN YOUR MENSTRUAL CYCLE. PLEASE INCREASE FLUIDS. USE MEDICATIONS AS SUGGESTED. TYLENOL CODEINE MAY CAUSE DROWSINESS, USE WITH CAUTION. USE IBUPROFEN WITH FOOD.                                                                                                                   Dysmenorrhea is pain during a menstrual period. You will have pain in the lower belly (abdomen). The pain is caused by the tightening (contracting) of the muscles of the uterus. The pain can be minor or severe. Headache, feeling sick to your stomach (nausea), throwing up (vomiting), or low back pain may occur with this condition. HOME CARE  Only take medicine as told by your doctor.  Place a heating pad or hot water bottle on your lower back or belly. Do not sleep with a heating pad.  Exercise may help lessen the pain.  Massage the lower back or belly.  Stop smoking.  Avoid alcohol and caffeine. GET HELP IF:   Your pain does not get better with medicine.  You have pain during sex.  Your pain gets worse while taking pain medicine.  Your period bleeding is heavier than normal.  You keep feeling sick to your stomach or keep throwing up. GET HELP RIGHT AWAY IF: You pass out (faint).   This information is not intended to replace advice given to you by your health care provider. Make sure you discuss any questions you have with your health care provider.   Document Released: 09/07/2008 Document Revised: 06/16/2013 Document Reviewed: 11/27/2012 Elsevier Interactive Patient Education [employer]2016 Else[employer] Inc.

## 2015-07-12 ENCOUNTER — Ambulatory Visit (INDEPENDENT_AMBULATORY_CARE_PROVIDER_SITE_OTHER): Payer: BLUE CROSS/BLUE SHIELD | Admitting: Pediatrics

## 2015-07-12 ENCOUNTER — Encounter: Payer: Self-pay | Admitting: Pediatrics

## 2015-07-12 VITALS — BP 158/100 | HR 92 | Temp 98.1°F | Ht 67.0 in | Wt 205.4 lb

## 2015-07-12 DIAGNOSIS — R399 Unspecified symptoms and signs involving the genitourinary system: Secondary | ICD-10-CM | POA: Diagnosis not present

## 2015-07-12 DIAGNOSIS — I10 Essential (primary) hypertension: Secondary | ICD-10-CM

## 2015-07-12 DIAGNOSIS — R109 Unspecified abdominal pain: Secondary | ICD-10-CM

## 2015-07-12 LAB — POCT URINALYSIS DIPSTICK
Bilirubin, UA: NEGATIVE
Blood, UA: NEGATIVE
Glucose, UA: NEGATIVE
Ketones, UA: NEGATIVE
Leukocytes, UA: NEGATIVE
Nitrite, UA: NEGATIVE
Spec Grav, UA: 1.015
Urobilinogen, UA: NEGATIVE
pH, UA: 7

## 2015-07-12 LAB — POCT UA - MICROSCOPIC ONLY
Bacteria, U Microscopic: NEGATIVE
Casts, Ur, LPF, POC: NEGATIVE
Crystals, Ur, HPF, POC: NEGATIVE
RBC, urine, microscopic: NEGATIVE
WBC, Ur, HPF, POC: NEGATIVE
Yeast, UA: NEGATIVE

## 2015-07-12 MED ORDER — HYDROCHLOROTHIAZIDE 25 MG PO TABS: 25.0000 mg | ORAL_TABLET | Freq: Every day | ORAL | Status: DC

## 2015-07-12 NOTE — Patient Instructions (Addendum)
Check blood pressure at home, write down numbers every day

## 2015-07-12 NOTE — Progress Notes (Signed)
Subjective:    Patient ID: Kathleen Perez, female    DOB: [birth year], 40 y.o.   MRN: [id number]  CC: Dysuria   HPI: Kathleen Perez is a 40 y.o. 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 Interim medical history since our last visit reviewed. Allergies and medications reviewed and updated.    ROS: Per HPI unless specifically indicated above  History  Smoking status  . Current Every Day Smoker  . Types: Cigarettes  Smokeless tobacco  . Not on file    Past Medical History There are no active problems to display for this patient.   Current Outpatient Prescriptions  Medication Sig Dispense Refill  . albuterol (PROVENTIL HFA;VENTOLIN HFA) 108 (90 BASE) MCG/ACT inhaler Inhale 2 puffs into the lungs every 4 (four) hours as needed for wheezing or shortness of breath. 1 Inhaler 0  . ferrous sulfate 325 (65 FE) MG tablet Take 325 mg by mouth daily with breakfast.    . ibuprofen (ADVIL,MOTRIN) 600 MG tablet Take 1 tablet (600 mg total) by mouth every 6 (six) hours as needed. 20 tablet 0  . naproxen (NAPROSYN) 500 MG tablet Take 1 tablet (500 mg total) by mouth 2 (two) times daily. 30 tablet 0  . hydrochlorothiazide (HYDRODIURIL) 25 MG tablet Take 1 tablet (25 mg total) by mouth daily. 90 tablet 3   No current facility-administered medications for this visit.       Objective:    BP 158/100 mmHg  Pulse 92  Temp(Src) 98.1 F (36.7 C) (Oral)  Ht '5\' 7"'  (1.702 m)  Wt 205 lb 6 oz (93.157 kg)  BMI 32.16 kg/m2  LMP 06/28/2015 (Approximate)    Wt Readings from Last 3 Encounters:  07/12/15 205 lb 6 oz (93.157 kg)  04/30/15 210 lb (95.255 kg)  03/18/15 201 lb 14.4 oz (91.581 kg)     Gen: NAD, alert, cooperative with exam, NCAT EYES: EOMI, no scleral injection or icterus ENT:  TMs pearly gray b/l, OP without erythema LYMPH: no cervical LAD CV: NRRR, normal S1/S2, no murmur, distal pulses 2+ b/l Resp: CTABL, no wheezes, normal WOB Abd: +BS, soft, mildly tender with palpation LLQ, achey with CVA palpation L flank, ND. no guarding or organomegaly Ext: No edema, warm Neuro: Alert and oriented, strength equal b/l UE and LE, coordination grossly normal MSK: normal muscle bulk     Assessment & Plan:    Kathleen Perez was seen today for dysuria, L sided flank pain, urine negative for infection or RBCs. Will get CT renal to look for stone. Discussed return precautions.  Diagnoses and all orders for this visit:  UTI symptoms -     POCT urinalysis dipstick -     POCT UA - Microscopic Only  Essential hypertension Uncontrolled. On atenolol which is not ideal BP medicine. Didn't take it today yet. Pt on oral OCP, not interested in any more children. Will switch to HCTZ, stop atenolol, take BPs at home every day for 2 weeks, RTC for nurse recheck, RTC  4 weeks to follow up with me re BP and meds. -     hydrochlorothiazide (HYDRODIURIL) 25 MG tablet; Take 1 tablet (25 mg total) by mouth daily. -     BMP8+EGFR  Flank pain -     CT RENAL STONE STUDY; Future   Follow up plan: Return for 2 weeks for BP recheck, 4 weeks to see Dr. Evette Doffing.  Assunta Found, MD [facility] 07/12/2015, 2:04 PM

## 2015-07-13 ENCOUNTER — Telehealth: Payer: Self-pay | Admitting: Pediatrics

## 2015-07-13 MED ORDER — KETOROLAC TROMETHAMINE 10 MG PO TABS: 10.0000 mg | ORAL_TABLET | Freq: Four times a day (QID) | ORAL | Status: DC | PRN

## 2015-07-13 NOTE — Telephone Encounter (Signed)
I sent in 20 tabs of toradol , may take every 6 hours, for no more than 5 days. No more than  in 24h.

## 2015-07-13 NOTE — Telephone Encounter (Signed)
Pt aware of medication send to pharmacy

## 2015-07-13 NOTE — Telephone Encounter (Signed)
Call given to debbi

## 2015-07-14 ENCOUNTER — Emergency Department (HOSPITAL_COMMUNITY)
Admission: EM | Admit: 2015-07-14 | Discharge: 2015-07-14 | Disposition: A | Discharge status: Home / Self Care | Payer: BLUE CROSS/BLUE SHIELD | Attending: Emergency Medicine | Admitting: Emergency Medicine

## 2015-07-14 ENCOUNTER — Ambulatory Visit (HOSPITAL_COMMUNITY): Admission: RE | Admit: 2015-07-14 | Payer: BLUE CROSS/BLUE SHIELD | Source: Ambulatory Visit

## 2015-07-14 ENCOUNTER — Emergency Department (HOSPITAL_COMMUNITY): Payer: BLUE CROSS/BLUE SHIELD

## 2015-07-14 ENCOUNTER — Encounter (HOSPITAL_COMMUNITY): Payer: Self-pay | Admitting: Emergency Medicine

## 2015-07-14 DIAGNOSIS — Z87442 Personal history of urinary calculi: Secondary | ICD-10-CM | POA: Insufficient documentation

## 2015-07-14 DIAGNOSIS — F17210 Nicotine dependence, cigarettes, uncomplicated: Secondary | ICD-10-CM | POA: Insufficient documentation

## 2015-07-14 DIAGNOSIS — R300 Dysuria: Secondary | ICD-10-CM | POA: Insufficient documentation

## 2015-07-14 DIAGNOSIS — Z8614 Personal history of Methicillin resistant Staphylococcus aureus infection: Secondary | ICD-10-CM | POA: Insufficient documentation

## 2015-07-14 DIAGNOSIS — Z791 Long term (current) use of non-steroidal anti-inflammatories (NSAID): Secondary | ICD-10-CM | POA: Insufficient documentation

## 2015-07-14 DIAGNOSIS — R110 Nausea: Secondary | ICD-10-CM | POA: Insufficient documentation

## 2015-07-14 DIAGNOSIS — M419 Scoliosis, unspecified: Secondary | ICD-10-CM | POA: Insufficient documentation

## 2015-07-14 DIAGNOSIS — R109 Unspecified abdominal pain: Secondary | ICD-10-CM | POA: Insufficient documentation

## 2015-07-14 DIAGNOSIS — M549 Dorsalgia, unspecified: Secondary | ICD-10-CM | POA: Diagnosis not present

## 2015-07-14 DIAGNOSIS — Z9889 Other specified postprocedural states: Secondary | ICD-10-CM | POA: Insufficient documentation

## 2015-07-14 DIAGNOSIS — Z3202 Encounter for pregnancy test, result negative: Secondary | ICD-10-CM | POA: Insufficient documentation

## 2015-07-14 DIAGNOSIS — Z79899 Other long term (current) drug therapy: Secondary | ICD-10-CM | POA: Diagnosis not present

## 2015-07-14 DIAGNOSIS — Z87440 Personal history of urinary (tract) infections: Secondary | ICD-10-CM | POA: Diagnosis not present

## 2015-07-14 DIAGNOSIS — R319 Hematuria, unspecified: Secondary | ICD-10-CM | POA: Insufficient documentation

## 2015-07-14 LAB — BASIC METABOLIC PANEL
Anion gap: 8 (ref 5–15)
BUN: 16 mg/dL (ref 6–20)
CO2: 26 mmol/L (ref 22–32)
Calcium: 9.2 mg/dL (ref 8.9–10.3)
Chloride: 104 mmol/L (ref 101–111)
Creatinine, Ser: 0.6 mg/dL (ref 0.44–1.00)
GFR calc Af Amer: >60 mL/min (ref >60)
GFR calc non Af Amer: >60 mL/min (ref >60)
Glucose, Bld: 95 mg/dL (ref 65–99)
POTASSIUM: 4.3 mmol/L (ref 3.5–5.1)
Sodium: 138 mmol/L (ref 135–145)

## 2015-07-14 LAB — CBC WITH DIFFERENTIAL/PLATELET
BASOS ABS: 0.1 10*3/uL (ref 0.0–0.1)
Basophils Relative: 1 %
Eosinophils Absolute: 0.6 10*3/uL (ref 0.0–0.7)
Eosinophils Relative: 4 %
HEMATOCRIT: 40.7 % (ref 36.0–46.0)
Hemoglobin: 13.1 g/dL (ref 12.0–15.0)
LYMPHS PCT: 23 %
Lymphs Abs: 3.1 10*3/uL (ref 0.7–4.0)
MCH: 28.8 pg (ref 26.0–34.0)
MCHC: 32.2 g/dL (ref 30.0–36.0)
MCV: 89.5 fL (ref 78.0–100.0)
MONO ABS: 1 10*3/uL (ref 0.1–1.0)
MONOS PCT: 7 %
NEUTROS ABS: 8.9 10*3/uL — AB (ref 1.7–7.7)
Neutrophils Relative %: 65 %
Platelets: 323 10*3/uL (ref 150–400)
RBC: 4.55 MIL/uL (ref 3.87–5.11)
RDW: 13.4 % (ref 11.5–15.5)
WBC: 13.7 10*3/uL — ABNORMAL HIGH (ref 4.0–10.5)

## 2015-07-14 LAB — URINALYSIS, ROUTINE W REFLEX MICROSCOPIC
Bilirubin Urine: NEGATIVE
GLUCOSE, UA: NEGATIVE mg/dL
HGB URINE DIPSTICK: NEGATIVE
Ketones, ur: NEGATIVE mg/dL
Leukocytes, UA: NEGATIVE
Nitrite: NEGATIVE
Protein, ur: NEGATIVE mg/dL
Specific Gravity, Urine: 1.015 (ref 1.005–1.030)
pH: 6 (ref 5.0–8.0)

## 2015-07-14 LAB — PREGNANCY, URINE: Preg Test, Ur: NEGATIVE

## 2015-07-14 MED ORDER — NAPROXEN 500 MG PO TABS: 500.0000 mg | ORAL_TABLET | Freq: Two times a day (BID) | ORAL | Status: DC

## 2015-07-14 MED ORDER — SODIUM CHLORIDE 0.9 % IV SOLN
INTRAVENOUS | Status: DC
  Administered 2015-07-14: 999 mL via INTRAVENOUS

## 2015-07-14 MED ORDER — FENTANYL CITRATE (PF) 100 MCG/2ML IJ SOLN
50.0000 ug | Freq: Once | INTRAMUSCULAR | Status: AC
  Administered 2015-07-14: 50 ug via INTRAVENOUS
  Filled 2015-07-14: qty 2

## 2015-07-14 MED ORDER — CYCLOBENZAPRINE HCL 10 MG PO TABS: 10.0000 mg | ORAL_TABLET | Freq: Two times a day (BID) | ORAL | Status: DC | PRN

## 2015-07-14 MED ORDER — SODIUM CHLORIDE 0.9 % IV BOLUS (SEPSIS)
1000.0000 mL | Freq: Once | INTRAVENOUS | Status: AC
  Administered 2015-07-14: 1000 mL via INTRAVENOUS

## 2015-07-14 MED ORDER — ONDANSETRON HCL 4 MG/2ML IJ SOLN
4.0000 mg | Freq: Once | INTRAMUSCULAR | Status: AC
  Administered 2015-07-14: 4 mg via INTRAVENOUS
  Filled 2015-07-14: qty 2

## 2015-07-14 NOTE — Discharge Instructions (Signed)
Either continue the port all we can switch over to Naprosyn. Also take the Flexeril with it. Make an appointment to follow-up with her regular doctor. Today's workup without evidence of any ureteral stones or evidence of urinary tract infection.

## 2015-07-14 NOTE — ED Notes (Signed)
Pt states left flank/back pain x 2 weeks. States she has a CT scheduled for today. Phoned her PCP a day or two ago and got Rx for Toradol since it helped in the past. States, "It only touched it this time." states she found 2 percocets from a previous prescription but took the last one last night and states that it helped with the pain.Pt unable to urinate at this time, stating she just went.

## 2015-07-14 NOTE — ED Notes (Signed)
Pt states that she has been having left flank pain with painful urination and frequency x 2 weeks.  Saw primary doctor and is scheduled for stone protocol today, but states that pain is too bad.  Last took Toradol 0730.

## 2015-07-14 NOTE — ED Provider Notes (Addendum)
CSN: [id number]     Arrival date & time 07/14/15  1216 History  By signing my name below, I, Terrance Branch, attest that this documentation has been prepared under the direction and in the presence of Vanetta Mulders, MD. Electronically Signed: Evon Slack, ED Scribe. 07/14/2015. 3:15 PM.    Chief Complaint  Patient presents with  . Flank Pain   Patient is a 40 y.o. female presenting with flank pain. The history is provided by the patient. No language interpreter was used.  Flank Pain Pertinent negatives include no chest pain, no abdominal pain, no headaches and no shortness of breath.   HPI Comments: NIXIE LAUBE is a 40 y.o. female who presents to the Emergency Department complaining of dull-aching left sided flank pain onset 2 weeks prior. Pt rates the severity of her pain 8/10. She reports associated dysuria described as a pressure when urinating. Pt does report some nausea last night. Pt states that she has tried Toradol and percocet with no relief. Pt denies fever or vomiting. Does report Hx of kidney stones.   Past Medical History  Diagnosis Date  . Kidney stone   . Scoliosis   . Urinary tract infection   . MRSA (methicillin resistant staph aureus) culture positive    Past Surgical History  Procedure Laterality Date  . Kidney stone surgery    . Cystectomy     History reviewed. No pertinent family history. Social History  Substance Use Topics  . Smoking status: Current Every Day Smoker    Types: Cigarettes  . Smokeless tobacco: None  . Alcohol Use: No   OB History    No data available     Review of Systems  Constitutional: Negative for fever and chills.  HENT: Negative for rhinorrhea and sore throat.   Eyes: Negative for visual disturbance.  Respiratory: Negative for cough and shortness of breath.   Cardiovascular: Negative for chest pain and leg swelling.  Gastrointestinal: Positive for nausea. Negative for vomiting, abdominal pain and diarrhea.   Genitourinary: Positive for dysuria, hematuria and flank pain.  Musculoskeletal: Positive for back pain. Negative for neck pain.  Skin: Negative for rash.  Neurological: Negative for headaches.  Hematological: Does not bruise/bleed easily.  Psychiatric/Behavioral: Negative for confusion.      Allergies  Review of patient's allergies indicates no known allergies.  Home Medications   Prior to Admission medications   Medication Sig Start Date End Date Taking? Authorizing Provider  albuterol (PROVENTIL HFA;VENTOLIN HFA) 108 (90 BASE) MCG/ACT inhaler Inhale 2 puffs into the lungs every 4 (four) hours as needed for wheezing or shortness of breath. 12/21/14   Shon Baton, MD  cyclobenzaprine (FLEXERIL) 10 MG tablet Take 1 tablet (10 mg total) by mouth 2 (two) times daily as needed for muscle spasms. 07/14/15   Vanetta Mulders, MD  ferrous sulfate 325 (65 FE) MG tablet Take 325 mg by mouth daily with breakfast.    Historical Provider, MD  hydrochlorothiazide (HYDRODIURIL) 25 MG tablet Take 1 tablet (25 mg total) by mouth daily. 07/12/15   Johna Sheriff, MD  ibuprofen (ADVIL,MOTRIN) 600 MG tablet Take 1 tablet (600 mg total) by mouth every 6 (six) hours as needed. 04/30/15   Ivery Quale, PA-C  ketorolac (TORADOL) 10 MG tablet Take 1 tablet (10 mg total) by mouth every 6 (six) hours as needed. 07/13/15   Johna Sheriff, MD  naproxen (NAPROSYN) 500 MG tablet Take 1 tablet (500 mg total) by mouth 2 (two) times daily. 12/17/13  Dione Booze, MD  naproxen (NAPROSYN) 500 MG tablet Take 1 tablet (500 mg total) by mouth 2 (two) times daily. 07/14/15   Vanetta Mulders, MD   BP 188/91 mmHg  Pulse 80  Temp(Src) 98.9 F (37.2 C) (Temporal)  Resp 16  Ht  (1.702 m)  Wt 92.987 kg  BMI 32.10 kg/m2  SpO2 98%  LMP 06/28/2015 (Approximate)   Physical Exam  Constitutional: She is oriented to person, place, and time. She appears well-developed and well-nourished. No distress.  HENT:  Head:  Normocephalic and atraumatic.  mucous membrane slightly dry.   Eyes: Conjunctivae and EOM are normal. Pupils are equal, round, and reactive to light. No scleral icterus.  Neck: Neck supple. No tracheal deviation present.  Cardiovascular: Normal rate, regular rhythm and normal heart sounds.   Pulmonary/Chest: Effort normal and breath sounds normal. No respiratory distress.  Abdominal: Soft. Bowel sounds are normal. There is no tenderness. There is no guarding and no CVA tenderness.  Non tender in left CVA area  Musculoskeletal: Normal range of motion. She exhibits no edema.  Neurological: She is alert and oriented to person, place, and time.  Skin: Skin is warm and dry.  Psychiatric: She has a normal mood and affect. Her behavior is normal.  Nursing note and vitals reviewed.   ED Course  Procedures (including critical care time) DIAGNOSTIC STUDIES: Oxygen Saturation is 98% on RA, normal by my interpretation.    COORDINATION OF CARE: 12:40 PM-Discussed treatment plan with pt at bedside and pt agreed to plan.     Labs Review Labs Reviewed  CBC WITH DIFFERENTIAL/PLATELET - Abnormal; Notable for the following:    WBC 13.7 (*)    Neutro Abs 8.9 (*)    All other components within normal limits  BASIC METABOLIC PANEL  URINALYSIS, ROUTINE W REFLEX MICROSCOPIC (NOT AT [facility])  PREGNANCY, URINE   Results for orders placed or performed during the hospital encounter of 07/14/15  Basic metabolic panel  Result Value Ref Range   Sodium 138 135 - 145 mmol/L   Potassium 4.3 3.5 - 5.1 mmol/L   Chloride 104 101 - 111 mmol/L   CO2 26 22 - 32 mmol/L   Glucose, Bld 95 65 - 99 mg/dL   BUN 16 6 - 20 mg/dL   Creatinine, Ser 1.61 0.44 - 1.00 mg/dL   Calcium 9.2 8.9 - 09.6 mg/dL   GFR calc non Af Amer >60 >60 mL/min   GFR calc Af Amer >60 >60 mL/min   Anion gap 8 5 - 15  CBC with Differential/Platelet  Result Value Ref Range   WBC 13.7 (H) 4.0 - 10.5 K/uL   RBC 4.55 3.87 - 5.11 MIL/uL    Hemoglobin 13.1 12.0 - 15.0 g/dL   HCT 04.5 40.9 - 81.1 %   MCV 89.5 78.0 - 100.0 fL   MCH 28.8 26.0 - 34.0 pg   MCHC 32.2 30.0 - 36.0 g/dL   RDW 91.4 78.2 - 95.6 %   Platelets 323 150 - 400 K/uL   Neutrophils Relative % 65 %   Neutro Abs 8.9 (H) 1.7 - 7.7 K/uL   Lymphocytes Relative 23 %   Lymphs Abs 3.1 0.7 - 4.0 K/uL   Monocytes Relative 7 %   Monocytes Absolute 1.0 0.1 - 1.0 K/uL   Eosinophils Relative 4 %   Eosinophils Absolute 0.6 0.0 - 0.7 K/uL   Basophils Relative 1 %   Basophils Absolute 0.1 0.0 - 0.1 K/uL  Urinalysis, Routine w  reflex microscopic (not at [facility])  Result Value Ref Range   Color, Urine YELLOW YELLOW   APPearance CLEAR CLEAR   Specific Gravity, Urine 1.015 1.005 - 1.030   pH 6.0 5.0 - 8.0   Glucose, UA NEGATIVE NEGATIVE mg/dL   Hgb urine dipstick NEGATIVE NEGATIVE   Bilirubin Urine NEGATIVE NEGATIVE   Ketones, ur NEGATIVE NEGATIVE mg/dL   Protein, ur NEGATIVE NEGATIVE mg/dL   Nitrite NEGATIVE NEGATIVE   Leukocytes, UA NEGATIVE NEGATIVE  Pregnancy, urine  Result Value Ref Range   Preg Test, Ur NEGATIVE NEGATIVE     Imaging Review Ct Renal Stone Study  07/14/2015  CLINICAL DATA:  Left flank pain for 2 weeks. Pressure with urination. Nausea. History of renal stones. EXAM: CT ABDOMEN AND PELVIS WITHOUT CONTRAST TECHNIQUE: Multidetector CT imaging of the abdomen and pelvis was performed following the standard protocol without IV contrast. COMPARISON:  09/29/2008 FINDINGS: The visualized lung bases are clear. Diffusely decreased attenuation of the liver is consistent with hepatic steatosis. Mild fatty sparing is noted along the gallbladder fossa. The gallbladder, spleen, adrenal glands, and pancreas have an unremarkable unenhanced appearance. There are new, punctate nonobstructing calculi in the interpolar right kidney, interpolar left kidney, and left lower pole. No ureteral stones or ureteral dilatation is seen. There is no evidence of bowel obstruction or  inflammation. The appendix is unremarkable. Bladder is largely decompressed and unremarkable. Uterus and ovaries are unremarkable aside from an incidental 2.3 cm left ovarian cyst. No free fluid or enlarged lymph nodes are identified. No acute osseous abnormality is seen. IMPRESSION: 1. Bilateral nonobstructing nephrolithiasis. 2. Hepatic steatosis. Electronically Signed   By: Sebastian Ache M.D.   On: 07/14/2015 14:00      EKG Interpretation None      MDM   Final diagnoses:  Flank pain    Workup for the left CVA flank pain without any acute findings. Does have evidence of kidney stones in both kidneys but no ureteral stones. No evidence urinary tract infection. Labs without any significant abnormalities. CT does show evidence of a small left ovarian cyst possible could be related to the pain. Will treat patient symptomatically and have her follow-up with her regular doctor.  I personally performed the services described in this documentation, which was scribed in my presence. The recorded information has been reviewed and is accurate.        Vanetta Mulders, MD 07/14/15 1516  Vanetta Mulders, MD 07/14/15 1520

## 2015-07-14 NOTE — ED Notes (Signed)
Pt stated that her husband was in the parking area with sleeping child. Followed pt out and pt got into drivers seat of an empty vehicle, lit cigarette and began to pull off. Told pt at that time that police would be called if she left driving. Pt got out and went back  to waiting room. Pt initially stated that she was dropped off prior to being medicated.

## 2015-07-27 ENCOUNTER — Ambulatory Visit: Payer: BLUE CROSS/BLUE SHIELD | Admitting: Pediatrics

## 2015-07-29 ENCOUNTER — Encounter: Payer: Self-pay | Admitting: Pediatrics

## 2015-10-18 ENCOUNTER — Other Ambulatory Visit: Payer: Self-pay | Admitting: Pediatrics

## 2015-10-18 NOTE — Telephone Encounter (Signed)
Last filled 05-02-16

## 2016-08-16 ENCOUNTER — Other Ambulatory Visit: Payer: Self-pay | Admitting: Pediatrics

## 2016-08-16 DIAGNOSIS — I10 Essential (primary) hypertension: Secondary | ICD-10-CM

## 2016-10-22 NOTE — Progress Notes (Signed)
Done on 07/14/2015

## 2016-12-09 ENCOUNTER — Encounter (HOSPITAL_COMMUNITY): Payer: Self-pay | Admitting: Emergency Medicine

## 2016-12-09 ENCOUNTER — Emergency Department (HOSPITAL_COMMUNITY)
Admission: EM | Admit: 2016-12-09 | Discharge: 2016-12-09 | Disposition: A | Discharge status: Home Health | Payer: BLUE CROSS/BLUE SHIELD | Attending: Emergency Medicine | Admitting: Emergency Medicine

## 2016-12-09 DIAGNOSIS — L02215 Cutaneous abscess of perineum: Secondary | ICD-10-CM | POA: Diagnosis not present

## 2016-12-09 DIAGNOSIS — L0291 Cutaneous abscess, unspecified: Secondary | ICD-10-CM

## 2016-12-09 DIAGNOSIS — Z79899 Other long term (current) drug therapy: Secondary | ICD-10-CM | POA: Insufficient documentation

## 2016-12-09 DIAGNOSIS — F17210 Nicotine dependence, cigarettes, uncomplicated: Secondary | ICD-10-CM | POA: Diagnosis not present

## 2016-12-09 MED ORDER — LIDOCAINE HCL (PF) 1 % IJ SOLN
5.0000 mL | Freq: Once | INTRAMUSCULAR | Status: AC
  Administered 2016-12-09: 5 mL
  Filled 2016-12-09: qty 5

## 2016-12-09 MED ORDER — DOXYCYCLINE HYCLATE 100 MG PO CAPS: 100.0000 mg | ORAL_CAPSULE | Freq: Two times a day (BID) | ORAL | 0 refills | Status: DC

## 2016-12-09 NOTE — ED Provider Notes (Signed)
[facility] Provider Note   CSN: [id number] Arrival date & time: 12/09/16  1410     History   Chief Complaint Chief Complaint  Patient presents with  . Abscess    HPI Kathleen Perez is a 41 y.o. female with a history of mrsa, presenting with abscess to her right groin which has been present for the past 5 days.  She reports occasional abscesses with need for I&D.  She denies fevers, chills, radiation of pain or drainage.  She tried to poke the site with a sterile needle last night but it was too painful and she was unsuccessful getting any drainage from the site.  She endorses shaving her groin only during the summer months and she typically has worse sx during this time.  The history is provided by the patient.    Past Medical History:  Diagnosis Date  . Kidney stone   . MRSA (methicillin resistant staph aureus) culture positive   . Scoliosis   . Urinary tract infection     There are no active problems to display for this patient.   Past Surgical History:  Procedure Laterality Date  . CYSTECTOMY    . KIDNEY STONE SURGERY      OB History    No data available       Home Medications    Prior to Admission medications   Medication Sig Start Date End Date Taking? Authorizing Provider  Aspirin-Acetaminophen-Caffeine (GOODY HEADACHE PO) Take 2 Packages by mouth daily as needed.   Yes [provider]  ferrous sulfate 325 (65 FE) MG tablet Take 325 mg by mouth daily with breakfast.   Yes [provider]  ibuprofen (ADVIL,MOTRIN) 200 MG tablet Take 800 mg by mouth every 6 (six) hours as needed for headache, mild pain or cramping.   Yes [provider]  doxycycline (VIBRAMYCIN) 100 MG capsule Take 1 capsule (100 mg total) by mouth 2 (two) times daily. 12/09/16   Burgess AmorIdol, Avarie Tavano, PA-C    Family History No family history on file.  Social History Social History  Substance Use Topics  . Smoking status: Current Every Day Smoker    Types:  Cigarettes  . Smokeless tobacco: Former NeurosurgeonUser  . Alcohol use No     Allergies   Patient has no known allergies.   Review of Systems Review of Systems  Constitutional: Negative for chills and fever.  Respiratory: Negative for shortness of breath and wheezing.   Skin: Positive for color change and wound.  Neurological: Negative for numbness.     Physical Exam Updated Vital Signs BP (!) 169/93 (BP Location: Right Arm)   Pulse 90   Temp 98.9 F (37.2 C) (Oral)   Resp 16   Ht 5\' 6"  (1.676 m)   Wt 93 kg (205 lb)   LMP 12/02/2016   SpO2 100%   BMI 33.09 kg/m   Physical Exam  Constitutional: She appears well-developed and well-nourished. No distress.  HENT:  Head: Normocephalic.  Neck: Neck supple.  Cardiovascular: Normal rate.   Pulmonary/Chest: Effort normal. She has no wheezes.  Musculoskeletal: Normal range of motion. She exhibits no edema.  Skin:  Mild folliculitis of mons pubis.  Indurated 1 cm nodule right mons without surrounding cellulitis.  No inguinal adenopathy. No drainage.     ED Treatments / Results  Labs (all labs ordered are listed, but only abnormal results are displayed) Labs Reviewed - No data to display  EKG  EKG Interpretation None  Radiology No results found.  Procedures Procedures (including critical care time)  INCISION AND DRAINAGE Performed by: Burgess Amor Consent: Verbal consent obtained. Risks and benefits: risks, benefits and alternatives were discussed Type: abscess  Body area: mons pubis  Anesthesia: local infiltration  Incision was made with a scalpel.  Local anesthetic: lidocaine 1% without epinephrine  Anesthetic total: 3 ml  Complexity: complex Blunt dissection to break up loculations  Drainage: purulent  Drainage amount: small  Packing material:  none  Patient tolerance: Patient tolerated the procedure well with no immediate complications.     Medications Ordered in ED Medications    lidocaine (PF) (XYLOCAINE) 1 % injection 5 mL (5 mLs Other Given 12/09/16 1618)     Initial Impression / Assessment and Plan / ED Course  I have reviewed the triage vital signs and the nursing notes.  Pertinent labs & imaging results that were available during my care of the patient were reviewed by me and considered in my medical decision making (see chart for details).     Doxy, warm soaks, prn f/u.  Discussed risks of shaving.  Advised to avoid this, if must, use new razor. Antibacterial soap, prn f/u for any persistent sx  Final Clinical Impressions(s) / ED Diagnoses   Final diagnoses:  Abscess    New Prescriptions New Prescriptions   DOXYCYCLINE (VIBRAMYCIN) 100 MG CAPSULE    Take 1 capsule (100 mg total) by mouth 2 (two) times daily.     Burgess Amor, PA-C 12/09/16 1654    Raeford Razor, MD 12/13/16 1050

## 2016-12-09 NOTE — ED Triage Notes (Signed)
Pt reports a labial area abscess for the last 5 days   [facility] is PCP

## 2016-12-09 NOTE — Discharge Instructions (Signed)
As discussed,  continue using warm water soaks 3 times daily for 10 minutes for the next 5 days.  Take the entire course of the antibiotics prescribed.

## 2016-12-09 NOTE — ED Triage Notes (Signed)
Patient complains of abscess around right lower abdomin.

## 2017-03-14 ENCOUNTER — Encounter (HOSPITAL_COMMUNITY): Payer: Self-pay | Admitting: *Deleted

## 2017-03-14 ENCOUNTER — Emergency Department (HOSPITAL_COMMUNITY)
Admission: EM | Admit: 2017-03-14 | Discharge: 2017-03-14 | Disposition: A | Discharge status: Home / Self Care | Payer: BLUE CROSS/BLUE SHIELD | Attending: Emergency Medicine | Admitting: Emergency Medicine

## 2017-03-14 DIAGNOSIS — Z23 Encounter for immunization: Secondary | ICD-10-CM | POA: Diagnosis not present

## 2017-03-14 DIAGNOSIS — F17210 Nicotine dependence, cigarettes, uncomplicated: Secondary | ICD-10-CM | POA: Diagnosis not present

## 2017-03-14 DIAGNOSIS — Z203 Contact with and (suspected) exposure to rabies: Secondary | ICD-10-CM | POA: Diagnosis not present

## 2017-03-14 DIAGNOSIS — M419 Scoliosis, unspecified: Secondary | ICD-10-CM | POA: Diagnosis not present

## 2017-03-14 DIAGNOSIS — Z79899 Other long term (current) drug therapy: Secondary | ICD-10-CM | POA: Insufficient documentation

## 2017-03-14 DIAGNOSIS — Z209 Contact with and (suspected) exposure to unspecified communicable disease: Secondary | ICD-10-CM

## 2017-03-14 MED ORDER — RABIES VACCINE, PCEC IM SUSR
1.0000 mL | Freq: Once | INTRAMUSCULAR | Status: AC
  Administered 2017-03-14: 1 mL via INTRAMUSCULAR
  Filled 2017-03-14: qty 1

## 2017-03-14 NOTE — ED Triage Notes (Signed)
Pt is here along with family. Last year they were exposed to a bat and got the rabies vaccines. Today they woke up with a bat in their house. They called the CDC and they advised them to come and get a rabies vaccine booster. NAD noted.  

## 2017-03-14 NOTE — ED Notes (Addendum)
Poison control contacted and they (denise) stated that they needed to get the rabies vaccine only. 

## 2017-03-14 NOTE — Discharge Instructions (Signed)
Follow-up with [facility] for next vaccine

## 2017-03-14 NOTE — ED Provider Notes (Signed)
[facility] Provider Note   CSN: [id number] Arrival date & time: 03/14/17  1738     History   Chief Complaint Chief Complaint  Patient presents with  . Rabies Injection    HPI Kathleen Perez is a 41 y.o. female.  HPI   Kathleen Perez is a 41 y.o. female who presents to the Emergency Department requesting rabies vaccination.  States she woke up with a bat in the bedroom. She denies known bite. She had a similar incident in 2016 in which a bat was found in her home, she received rabies vaccines and immunoglobulin at that time. She denies any symptoms at present. She states the bats are coming from her neighbors house.  Past Medical History:  Diagnosis Date  . Kidney stone   . MRSA (methicillin resistant staph aureus) culture positive   . Scoliosis   . Urinary tract infection     There are no active problems to display for this patient.   Past Surgical History:  Procedure Laterality Date  . CYSTECTOMY    . KIDNEY STONE SURGERY      OB History    No data available       Home Medications    Prior to Admission medications   Medication Sig Start Date End Date Taking? Authorizing Provider  Aspirin-Acetaminophen-Caffeine (GOODY HEADACHE PO) Take 2 Packages by mouth daily as needed.    [provider]  doxycycline (VIBRAMYCIN) 100 MG capsule Take 1 capsule (100 mg total) by mouth 2 (two) times daily. 12/09/16   Burgess Amor, PA-C  ferrous sulfate 325 (65 FE) MG tablet Take 325 mg by mouth daily with breakfast.    [provider]  ibuprofen (ADVIL,MOTRIN) 200 MG tablet Take 800 mg by mouth every 6 (six) hours as needed for headache, mild pain or cramping.    [provider]    Family History No family history on file.  Social History Social History  Substance Use Topics  . Smoking status: Current Every Day Smoker    Types: Cigarettes  . Smokeless tobacco: Former Neurosurgeon  . Alcohol use No     Allergies   Patient has no known  allergies.   Review of Systems Review of Systems  Constitutional: Negative for chills, fatigue and fever.  HENT: Negative for trouble swallowing.   Respiratory: Negative for cough, shortness of breath and wheezing.   Cardiovascular: Negative for chest pain and palpitations.  Gastrointestinal: Negative for abdominal pain, nausea and vomiting.  Genitourinary: Negative for dysuria and flank pain.  Musculoskeletal: Negative for arthralgias, back pain, myalgias, neck pain and neck stiffness.  Skin: Negative for rash.  Neurological: Negative for dizziness, weakness and numbness.  Hematological: Does not bruise/bleed easily.     Physical Exam Updated Vital Signs BP (!) 168/91 (BP Location: Right Arm)   Pulse 80   Temp 98 F (36.7 C) (Temporal)   Resp 18   Ht  (1.676 m)   Wt 93 kg (205 lb)   SpO2 97%   BMI 33.09 kg/m   Physical Exam  Constitutional: She is oriented to person, place, and time. She appears well-developed and well-nourished. No distress.  HENT:  Head: Normocephalic and atraumatic.  Neck: Normal range of motion. Neck supple.  Cardiovascular: Normal rate, regular rhythm and intact distal pulses.   Pulmonary/Chest: Effort normal and breath sounds normal.  Abdominal: Soft. There is no tenderness. There is no rebound and no guarding.  Musculoskeletal: Normal range of motion. She exhibits no  tenderness.  Lymphadenopathy:    She has no cervical adenopathy.  Neurological: She is alert and oriented to person, place, and time. No sensory deficit.  Skin: Skin is warm and dry. Capillary refill takes less than 2 seconds.  Psychiatric: Judgment normal.  Nursing note and vitals reviewed.    ED Treatments / Results  Labs (all labs ordered are listed, but only abnormal results are displayed) Labs Reviewed - No data to display  EKG  EKG Interpretation None       Radiology No results found.  Procedures Procedures (including critical care time)  Medications  Ordered in ED Medications  rabies vaccine (RABAVERT) injection 1 mL (not administered)     Initial Impression / Assessment and Plan / ED Course  I have reviewed the triage vital signs and the nursing notes.  Pertinent labs & imaging results that were available during my care of the patient were reviewed by me and considered in my medical decision making (see chart for details).     Contacted poison control.  Recommended rabies vaccines for day 0 and day 3.  No immuno globin indicated.  Pt agrees to f/u with [facility] for next vaccine.     Final Clinical Impressions(s) / ED Diagnoses   Final diagnoses:  Exposure to bat without known bite    New Prescriptions New Prescriptions   No medications on file     Pauline Aus, Cordelia Poche 03/17/17 1857    Maia Plan, MD 03/18/17 1312

## 2017-03-17 ENCOUNTER — Encounter (HOSPITAL_COMMUNITY): Payer: Self-pay | Admitting: Emergency Medicine

## 2017-03-17 ENCOUNTER — Ambulatory Visit (HOSPITAL_COMMUNITY)
Admission: EM | Admit: 2017-03-17 | Discharge: 2017-03-17 | Disposition: A | Discharge status: Home / Self Care | Payer: BLUE CROSS/BLUE SHIELD | Attending: Internal Medicine | Admitting: Internal Medicine

## 2017-03-17 DIAGNOSIS — Z203 Contact with and (suspected) exposure to rabies: Secondary | ICD-10-CM | POA: Diagnosis not present

## 2017-03-17 DIAGNOSIS — Z23 Encounter for immunization: Secondary | ICD-10-CM

## 2017-03-17 MED ORDER — RABIES VACCINE, PCEC IM SUSR
INTRAMUSCULAR | Status: AC
  Filled 2017-03-17: qty 1

## 2017-03-17 MED ORDER — RABIES VACCINE, PCEC IM SUSR
1.0000 mL | Freq: Once | INTRAMUSCULAR | Status: AC
  Administered 2017-03-17: 1 mL via INTRAMUSCULAR

## 2017-03-17 NOTE — ED Triage Notes (Signed)
Pt here for day 3 rabies vaccination .... Voices no new concerns  A&O x4... NAD... Ambulatory  

## 2017-03-26 ENCOUNTER — Encounter (HOSPITAL_COMMUNITY): Payer: Self-pay | Admitting: Cardiology

## 2017-03-26 ENCOUNTER — Emergency Department (HOSPITAL_COMMUNITY)
Admission: EM | Admit: 2017-03-26 | Discharge: 2017-03-26 | Disposition: A | Discharge status: Home / Self Care | Payer: BLUE CROSS/BLUE SHIELD | Attending: Emergency Medicine | Admitting: Emergency Medicine

## 2017-03-26 DIAGNOSIS — K0889 Other specified disorders of teeth and supporting structures: Secondary | ICD-10-CM | POA: Diagnosis present

## 2017-03-26 DIAGNOSIS — Z791 Long term (current) use of non-steroidal anti-inflammatories (NSAID): Secondary | ICD-10-CM | POA: Diagnosis not present

## 2017-03-26 DIAGNOSIS — I10 Essential (primary) hypertension: Secondary | ICD-10-CM | POA: Diagnosis not present

## 2017-03-26 DIAGNOSIS — Z79899 Other long term (current) drug therapy: Secondary | ICD-10-CM | POA: Diagnosis not present

## 2017-03-26 DIAGNOSIS — F17210 Nicotine dependence, cigarettes, uncomplicated: Secondary | ICD-10-CM | POA: Insufficient documentation

## 2017-03-26 HISTORY — DX: Essential (primary) hypertension: I10

## 2017-03-26 MED ORDER — TRAMADOL HCL 50 MG PO TABS: 50.0000 mg | ORAL_TABLET | Freq: Four times a day (QID) | ORAL | 0 refills | Status: DC | PRN

## 2017-03-26 MED ORDER — PENICILLIN V POTASSIUM 500 MG PO TABS: 500.0000 mg | ORAL_TABLET | Freq: Three times a day (TID) | ORAL | 0 refills | Status: DC

## 2017-03-26 NOTE — ED Provider Notes (Signed)
[facility]-EMERGENCY DEPT Provider Note   CSN: [id number] Arrival date & time: 03/26/17  1500     History   Chief Complaint Chief Complaint  Patient presents with  . Dental Pain    HPI Kathleen Perez is a 41 y.o. female.  HPI   41 year old female with history of hypertension presenting complaining of dental pain. patient report last night while eating dinner, she chipped her left upper molar and since then she has had persistent dental pain.describe pain as a sharp throbbing sensation, 8 out of 10, now improved with taking ibuprofen earlier. No associated fever, trouble swallowing, or neck pain or ear pain. She did reach out to her dentist Dr. Janice Norrie and is scheduled to have an appointment tomorrow however she is here requesting for pain control.  Past Medical History:  Diagnosis Date  . Hypertension   . Kidney stone   . MRSA (methicillin resistant staph aureus) culture positive   . Scoliosis   . Urinary tract infection     There are no active problems to display for this patient.   Past Surgical History:  Procedure Laterality Date  . CYSTECTOMY    . KIDNEY STONE SURGERY      OB History    No data available       Home Medications    Prior to Admission medications   Medication Sig Start Date End Date Taking? Authorizing Provider  Aspirin-Acetaminophen-Caffeine (GOODY HEADACHE PO) Take 2 Packages by mouth daily as needed.    [provider]  doxycycline (VIBRAMYCIN) 100 MG capsule Take 1 capsule (100 mg total) by mouth 2 (two) times daily. 12/09/16   Burgess Amor, PA-C  ferrous sulfate 325 (65 FE) MG tablet Take 325 mg by mouth daily with breakfast.    [provider]  ibuprofen (ADVIL,MOTRIN) 200 MG tablet Take 800 mg by mouth every 6 (six) hours as needed for headache, mild pain or cramping.    [provider]    Family History History reviewed. No pertinent family history.  Social History Social History  Substance Use Topics  .  Smoking status: Current Every Day Smoker    Types: Cigarettes  . Smokeless tobacco: Former Neurosurgeon  . Alcohol use No     Allergies   Patient has no known allergies.   Review of Systems Review of Systems  Constitutional: Negative for fever.  HENT: Positive for dental problem. Negative for facial swelling and trouble swallowing.   Musculoskeletal: Negative for neck pain.  Neurological: Positive for headaches.     Physical Exam Updated Vital Signs BP (!) 180/106 (BP Location: Right Arm) Comment: Missed dose of Toprol this morning   Pulse 96   Temp 98.2 F (36.8 C) (Oral)   Resp 18   Ht  (1.676 m)   Wt 91.6 kg (202 lb)   SpO2 96%   BMI 32.60 kg/m   Physical Exam  Constitutional: She appears well-developed and well-nourished. No distress.  HENT:  Head: Atraumatic.  Mouth: Partial dental fracture (20%) involving tooth #15 that is tender to palpation.no obvious abscess of facial involvement noted.  Eyes: Conjunctivae are normal.  Neck: Neck supple.  Neurological: She is alert.  Skin: No rash noted.  Psychiatric: She has a normal mood and affect.  Nursing note and vitals reviewed.    ED Treatments / Results  Labs (all labs ordered are listed, but only abnormal results are displayed) Labs Reviewed - No data to display  EKG  EKG Interpretation None  Radiology No results found.  Procedures Procedures (including critical care time)  Medications Ordered in ED Medications - No data to display   Initial Impression / Assessment and Plan / ED Course  I have reviewed the triage vital signs and the nursing notes.  Pertinent labs & imaging results that were available during my care of the patient were reviewed by me and considered in my medical decision making (see chart for details).     BP (!) 161/92 (BP Location: Right Arm)   Pulse 96   Temp 98.4 F (36.9 C) (Oral)   Resp 16   Ht  (1.676 m)   Wt 91.6 kg (202 lb)   SpO2 96%   BMI 32.60  kg/m  The patient was noted to be hypertensive today in the emergency department. I have spoken with the patient regarding hypertension and the need for improved management. I instructed the patient to followup with the Primary care doctor within 4 days to improve the management of the patient's hypertension. I also counseled the patient regarding the signs and symptoms which would require an emergent visit to an emergency department for hypertensive urgency and/or hypertensive emergency. The patient understood the need for improved hypertensive management.   Final Clinical Impressions(s) / ED Diagnoses   Final diagnoses:  Toothache    New Prescriptions New Prescriptions   PENICILLIN V POTASSIUM (VEETID) 500 MG TABLET    Take 1 tablet (500 mg total) by mouth 3 (three) times daily.   TRAMADOL (ULTRAM) 50 MG TABLET    Take 1 tablet (50 mg total) by mouth every 6 (six) hours as needed for moderate pain or severe pain.   3:39 PM Patient chipped her left upper molar last night and now developing dental pain. She does have an appointment to be seen by her dentist tomorrow. Will discharge patient home with antibiotic, and a short course of opiate pain medication. In order to decrease risk of narcotic abuse. Pt's record were checked using the [place] Controlled Substance database.     Fayrene Helper, PA-C 03/26/17 1604    Donnetta Hutching, MD 03/27/17 1320

## 2017-03-26 NOTE — ED Triage Notes (Signed)
Left upper dental pain today.  Unable to get into dentist.

## 2017-09-02 ENCOUNTER — Emergency Department (HOSPITAL_COMMUNITY)
Admission: EM | Admit: 2017-09-02 | Discharge: 2017-09-02 | Disposition: A | Discharge status: Home / Self Care | Payer: BLUE CROSS/BLUE SHIELD | Attending: Emergency Medicine | Admitting: Emergency Medicine

## 2017-09-02 ENCOUNTER — Other Ambulatory Visit: Payer: Self-pay

## 2017-09-02 ENCOUNTER — Encounter (HOSPITAL_COMMUNITY): Payer: Self-pay | Admitting: *Deleted

## 2017-09-02 DIAGNOSIS — Z79899 Other long term (current) drug therapy: Secondary | ICD-10-CM | POA: Insufficient documentation

## 2017-09-02 DIAGNOSIS — R040 Epistaxis: Secondary | ICD-10-CM | POA: Insufficient documentation

## 2017-09-02 DIAGNOSIS — F17210 Nicotine dependence, cigarettes, uncomplicated: Secondary | ICD-10-CM | POA: Insufficient documentation

## 2017-09-02 DIAGNOSIS — I10 Essential (primary) hypertension: Secondary | ICD-10-CM | POA: Insufficient documentation

## 2017-09-02 MED ORDER — TRANEXAMIC ACID 1000 MG/10ML IV SOLN
500.0000 mg | Freq: Once | INTRAVENOUS | Status: DC
  Filled 2017-09-02: qty 10

## 2017-09-02 MED ORDER — HYDROCHLOROTHIAZIDE 25 MG PO TABS
25.0000 mg | ORAL_TABLET | Freq: Every day | ORAL | Status: DC
  Administered 2017-09-02: 25 mg via ORAL
  Filled 2017-09-02: qty 1

## 2017-09-02 MED ORDER — AMOXICILLIN-POT CLAVULANATE 875-125 MG PO TABS: 1.0000 | ORAL_TABLET | Freq: Two times a day (BID) | ORAL | 0 refills | Status: DC

## 2017-09-02 MED ORDER — HYDROCHLOROTHIAZIDE 25 MG PO TABS: 25.0000 mg | ORAL_TABLET | Freq: Every day | ORAL | 0 refills | Status: DC

## 2017-09-02 MED ORDER — OXYMETAZOLINE HCL 0.05 % NA SOLN
NASAL | Status: AC
  Filled 2017-09-02: qty 15

## 2017-09-02 MED ORDER — TRANEXAMIC ACID 1000 MG/10ML IV SOLN
INTRAVENOUS | Status: AC
  Filled 2017-09-02: qty 10

## 2017-09-02 MED ORDER — AMOXICILLIN-POT CLAVULANATE 875-125 MG PO TABS
1.0000 | ORAL_TABLET | Freq: Once | ORAL | Status: AC
  Administered 2017-09-02: 1 via ORAL
  Filled 2017-09-02: qty 1

## 2017-09-02 MED ORDER — SILVER NITRATE-POT NITRATE 75-25 % EX MISC
CUTANEOUS | Status: AC
  Filled 2017-09-02: qty 2

## 2017-09-02 NOTE — ED Notes (Signed)
Pt ambulatory to waiting room. Pt verbalized understanding of discharge instructions.   

## 2017-09-02 NOTE — ED Provider Notes (Signed)
Emergency Department Provider Note   I have reviewed the triage vital signs and the nursing notes.   HISTORY  Chief Complaint Epistaxis   HPI Kathleen Perez is a 42 y.o. female with history of hypertension and kidney stones presents emerged from today with a nosebleed.  Patient states that she was picking her nose just prior to this happening and she started having significant amount of blood out of her right nare.  This lasted about an hours they called EMS EMS tried Afrin a couple times on the way and without any improvement symptoms.  Patient is hypertensive and anxious with them.  On arrival here she is asymptomatic besides having the nosebleed which is still present.  With the nurse holding her front knows it is seeming to go down the back of her throat.  She has been coughing this up and spit it out but no vomiting at this time.  Not any blood thinners.  No history of the same. No other associated or modifying symptoms.    Past Medical History:  Diagnosis Date  . Hypertension   . Kidney stone   . MRSA (methicillin resistant staph aureus) culture positive   . Scoliosis   . Urinary tract infection     There are no active problems to display for this patient.   Past Surgical History:  Procedure Laterality Date  . CYSTECTOMY    . KIDNEY STONE SURGERY      Current Outpatient Rx  . Order #: [id number] Class: Historical Med  . Order #: [id number] Class: Historical Med  . Order #: [id number] Class: Print  . Order #: [id number] Class: Historical Med  . Order #: [id number] Class: Historical Med  . Order #: [id number] Class: Print  . Order #: [id number] Class: Print    Allergies Patient has no known allergies.  History reviewed. No pertinent family history.  Social History Social History   Tobacco Use  . Smoking status: Current Every Day Smoker    Packs/day: 1.00    Types: Cigarettes  . Smokeless tobacco: Former Engineer, waterUser  Substance Use Topics  . Alcohol use: No  . Drug use: No     Review of Systems  All other systems negative except as documented in the HPI. All pertinent positives and negatives as reviewed in the HPI. ____________________________________________   PHYSICAL EXAM:  VITAL SIGNS: ED Triage Vitals  Enc Vitals Group     BP 09/02/17 2046 (!) 195/113     Pulse Rate 09/02/17 2046 (!) 107     Resp 09/02/17 2046 20     Temp --      Temp src --      SpO2 09/02/17 2046 99 %     Weight 09/02/17 2048 210 lb (95.3 kg)     Height 09/02/17 2048 5\' 6"  (1.676 m)    Constitutional: Alert and mildly distressed 2/2 nosebleed. Well appearing and in no acute distress. Eyes: Conjunctivae are normal. PERRL. EOMI. Head: Atraumatic. Nose: large amount Of bright red blood and clotting.  No obvious source for the bleed. Mouth/Throat: Mucous membranes are moist.  Oropharynx non-erythematous. Neck: No stridor.  No meningeal signs.   Cardiovascular: Normal rate, regular rhythm. Good peripheral circulation. Grossly normal heart sounds.   Respiratory: Normal respiratory effort.  No retractions. Lungs CTAB. Gastrointestinal: Soft and nontender. No distention.  Musculoskeletal: No lower extremity tenderness nor edema. No gross deformities of extremities. Neurologic:  Normal speech and language. No gross focal neurologic deficits are appreciated.  Skin:  Skin is warm, dry and  intact. No rash noted.   ____________________________________________    PROCEDURES  Procedure(s) performed:   .Epistaxis Management Date/Time: 09/03/2017 12:02 AM Performed by: Marily Memos, MD Authorized by: Marily Memos, MD   Consent:    Consent obtained:  Verbal   Consent given by:  Patient   Risks discussed:  Bleeding and infection   Alternatives discussed:  Delayed treatment, no treatment and alternative treatment Anesthesia (see MAR for exact dosages):    Anesthesia method:  None Procedure details:    Treatment site:  R anterior   Treatment method:  Nasal balloon    Treatment complexity:  Limited   Treatment episode: initial   Post-procedure details:    Assessment:  Bleeding decreased   Patient tolerance of procedure:  Tolerated well, no immediate complications     ____________________________________________   INITIAL IMPRESSION / ASSESSMENT AND PLAN / ED COURSE  Will try blowing nose, afrin and pressure for 10 minutes and reassess.   Pressure and Afrin twice without any improvement so rapid Rhino deployed with significant reduction in bleeding and just a mild oozing from the rapid Rhino itself.  Patient feels she is much improved and wants to go home.  Started on Augmentin and will follow up with ENT.  Heart rate improved so I suspect is more related to the pain and anxiety at the nosebleed low suspicion for hypovolemic shock at this time.  She asked for a refill for hydrochlorothiazide which I provided.     Pertinent labs & imaging results that were available during my care of the patient were reviewed by me and considered in my medical decision making (see chart for details).  ____________________________________________  FINAL CLINICAL IMPRESSION(S) / ED DIAGNOSES  Final diagnoses:  None     MEDICATIONS GIVEN DURING THIS VISIT:  Medications  silver nitrate applicators 75-25 % applicator (not administered)  oxymetazoline (AFRIN) 0.05 % nasal spray (not administered)     NEW OUTPATIENT MEDICATIONS STARTED DURING THIS VISIT:  New Prescriptions   No medications on file    Note:  This note was prepared with assistance of Dragon voice recognition software. Occasional wrong-word or sound-a-like substitutions may have occurred due to the inherent limitations of voice recognition software.   Tambi Thole, Barbara Cower, MD 09/03/17 [phone]

## 2017-09-02 NOTE — ED Triage Notes (Signed)
Pt brought in by [facility] for c/o nosebleed and headache; pt has been c/o headache x 3 days and sudden nosebleed x 1 hour; pt scratched inside of her nose with her finger; pt's bp 190's/100's; ems used afrin and cold pack in route

## 2017-09-03 ENCOUNTER — Other Ambulatory Visit: Payer: Self-pay

## 2017-09-03 ENCOUNTER — Emergency Department (HOSPITAL_COMMUNITY)
Admission: EM | Admit: 2017-09-03 | Discharge: 2017-09-03 | Disposition: A | Discharge status: Home / Self Care | Payer: BLUE CROSS/BLUE SHIELD | Attending: Emergency Medicine | Admitting: Emergency Medicine

## 2017-09-03 ENCOUNTER — Encounter (HOSPITAL_COMMUNITY): Payer: Self-pay | Admitting: *Deleted

## 2017-09-03 DIAGNOSIS — F17210 Nicotine dependence, cigarettes, uncomplicated: Secondary | ICD-10-CM | POA: Insufficient documentation

## 2017-09-03 DIAGNOSIS — R040 Epistaxis: Secondary | ICD-10-CM

## 2017-09-03 DIAGNOSIS — Z79899 Other long term (current) drug therapy: Secondary | ICD-10-CM | POA: Insufficient documentation

## 2017-09-03 DIAGNOSIS — I10 Essential (primary) hypertension: Secondary | ICD-10-CM | POA: Insufficient documentation

## 2017-09-03 LAB — CBC WITH DIFFERENTIAL/PLATELET
BASOS ABS: 0.1 10*3/uL (ref 0.0–0.1)
BASOS PCT: 1 %
EOS ABS: 0.5 10*3/uL (ref 0.0–0.7)
Eosinophils Relative: 4 %
HEMATOCRIT: 37.5 % (ref 36.0–46.0)
HEMOGLOBIN: 11.9 g/dL — AB (ref 12.0–15.0)
Lymphocytes Relative: 20 %
Lymphs Abs: 2.8 10*3/uL (ref 0.7–4.0)
MCH: 28.4 pg (ref 26.0–34.0)
MCHC: 31.7 g/dL (ref 30.0–36.0)
MCV: 89.5 fL (ref 78.0–100.0)
Monocytes Absolute: 0.7 10*3/uL (ref 0.1–1.0)
Monocytes Relative: 5 %
NEUTROS ABS: 9.7 10*3/uL — AB (ref 1.7–7.7)
NEUTROS PCT: 70 %
Platelets: 331 10*3/uL (ref 150–400)
RBC: 4.19 MIL/uL (ref 3.87–5.11)
RDW: 14 % (ref 11.5–15.5)
WBC: 13.8 10*3/uL — AB (ref 4.0–10.5)

## 2017-09-03 LAB — BASIC METABOLIC PANEL
ANION GAP: 9 (ref 5–15)
BUN: 22 mg/dL — ABNORMAL HIGH (ref 6–20)
CALCIUM: 8.8 mg/dL — AB (ref 8.9–10.3)
CHLORIDE: 103 mmol/L (ref 101–111)
CO2: 24 mmol/L (ref 22–32)
Creatinine, Ser: 0.62 mg/dL (ref 0.44–1.00)
GFR calc non Af Amer: >60 mL/min (ref >60)
Glucose, Bld: 129 mg/dL — ABNORMAL HIGH (ref 65–99)
Potassium: 4 mmol/L (ref 3.5–5.1)
SODIUM: 136 mmol/L (ref 135–145)

## 2017-09-03 LAB — PROTIME-INR
INR: 0.96
PROTHROMBIN TIME: 12.7 s (ref 11.4–15.2)

## 2017-09-03 MED ORDER — HYDROMORPHONE HCL 1 MG/ML IJ SOLN
0.5000 mg | Freq: Once | INTRAMUSCULAR | Status: AC
  Administered 2017-09-03: 0.5 mg via INTRAVENOUS
  Filled 2017-09-03: qty 1

## 2017-09-03 MED ORDER — ONDANSETRON HCL 4 MG/2ML IJ SOLN
4.0000 mg | Freq: Once | INTRAMUSCULAR | Status: AC
  Administered 2017-09-03: 4 mg via INTRAVENOUS
  Filled 2017-09-03: qty 2

## 2017-09-03 MED ORDER — LABETALOL HCL 5 MG/ML IV SOLN
10.0000 mg | Freq: Once | INTRAVENOUS | Status: AC
  Administered 2017-09-03: 10 mg via INTRAVENOUS
  Filled 2017-09-03: qty 4

## 2017-09-03 MED ORDER — OXYMETAZOLINE HCL 0.05 % NA SOLN
NASAL | Status: AC
  Administered 2017-09-03: 2
  Filled 2017-09-03: qty 15

## 2017-09-03 MED ORDER — AMLODIPINE BESYLATE 5 MG PO TABS: 5.0000 mg | ORAL_TABLET | Freq: Every day | ORAL | 0 refills | Status: DC

## 2017-09-03 MED ORDER — HYDROMORPHONE HCL 1 MG/ML IJ SOLN
1.0000 mg | Freq: Once | INTRAMUSCULAR | Status: AC
  Administered 2017-09-03: 1 mg via INTRAVENOUS
  Filled 2017-09-03: qty 1

## 2017-09-03 MED ORDER — TRANEXAMIC ACID 1000 MG/10ML IV SOLN
500.0000 mg | Freq: Once | INTRAVENOUS | Status: AC
  Administered 2017-09-03: 500 mg via TOPICAL
  Filled 2017-09-03 (×2): qty 10

## 2017-09-03 NOTE — ED Notes (Signed)
Pt's nose is bleeding again and blood pressure is higher, edp notified.

## 2017-09-03 NOTE — ED Triage Notes (Signed)
Pt was seen here for same complaint earlier this evening; pt is still having bleeding from her nose

## 2017-09-03 NOTE — ED Notes (Signed)
Pt oozing from left nare. EDP notified. bp 160/80

## 2017-09-03 NOTE — Discharge Instructions (Addendum)
Do not take any aspirin, Motrin, ibuprofen, Aleve, Naprosyn, Goody's, BC powder as it may make the bleeding worse.

## 2017-09-03 NOTE — ED Provider Notes (Signed)
[facility] EMERGENCY DEPARTMENT Provider Note   CSN: [id number] Arrival date & time: 09/03/17  4098     History   Chief Complaint Chief Complaint  Patient presents with  . Epistaxis    HPI Kathleen Perez is a 42 y.o. female.  Patient presents to the ER for evaluation of nosebleed.  Patient was seen in the ER earlier today with nosebleed.  She had nasal packing placed.  She reports that tonight the bleeding started all over again.  Initially she started having bleeding around the nasal balloon and then the blood started going down the back of her throat and out the other side of her nose.  She denies any new trauma.      Past Medical History:  Diagnosis Date  . Hypertension   . Kidney stone   . MRSA (methicillin resistant staph aureus) culture positive   . Scoliosis   . Urinary tract infection     There are no active problems to display for this patient.   Past Surgical History:  Procedure Laterality Date  . CYSTECTOMY    . KIDNEY STONE SURGERY      OB History    No data available       Home Medications    Prior to Admission medications   Medication Sig Start Date End Date Taking? Authorizing Provider  amoxicillin-clavulanate (AUGMENTIN) 875-125 MG tablet Take 1 tablet by mouth 2 (two) times daily. One po bid x 7 days 09/02/17   Mesner, Barbara Cower, MD  Aspirin-Acetaminophen-Caffeine (GOODY HEADACHE PO) Take 2 Packages by mouth daily as needed.    [provider]  hydrochlorothiazide (HYDRODIURIL) 25 MG tablet Take 1 tablet (25 mg total) by mouth daily. 09/02/17   Mesner, Barbara Cower, MD  ibuprofen (ADVIL,MOTRIN) 200 MG tablet Take 800 mg by mouth every 6 (six) hours as needed for headache, mild pain or cramping.    [provider]    Family History History reviewed. No pertinent family history.  Social History Social History   Tobacco Use  . Smoking status: Current Every Day Smoker    Packs/day: 1.00    Types: Cigarettes  . Smokeless tobacco:  Former Engineer, water Use Topics  . Alcohol use: No  . Drug use: No     Allergies   Patient has no known allergies.   Review of Systems Review of Systems  HENT: Positive for nosebleeds.   All other systems reviewed and are negative.    Physical Exam Updated Vital Signs BP (!) 190/106   Pulse 94   Temp 98.6 F (37 C)   Resp 18   Ht 5\' 6"  (1.676 m)   Wt 95.3 kg (210 lb)   LMP 08/12/2017   SpO2 96%   BMI 33.89 kg/m   Physical Exam  Constitutional: She is oriented to person, place, and time. She appears well-developed and well-nourished. No distress.  HENT:  Head: Normocephalic and atraumatic.  Right Ear: Hearing normal.  Left Ear: Hearing normal.  Mouth/Throat: Oropharynx is clear and moist and mucous membranes are normal.  Nasal balloon in the right side of nose with blood dripping from end, blood and clots in left nare as well  Eyes: Conjunctivae and EOM are normal. Pupils are equal, round, and reactive to light.  Neck: Normal range of motion. Neck supple.  Cardiovascular: Regular rhythm, S1 normal and S2 normal. Exam reveals no gallop and no friction rub.  No murmur heard. Pulmonary/Chest: Effort normal and breath sounds normal. No respiratory distress.  She exhibits no tenderness.  Abdominal: Soft. Normal appearance and bowel sounds are normal. There is no hepatosplenomegaly. There is no tenderness. There is no rebound, no guarding, no tenderness at McBurney's point and negative Murphy's sign. No hernia.  Musculoskeletal: Normal range of motion.  Neurological: She is alert and oriented to person, place, and time. She has normal strength. No cranial nerve deficit or sensory deficit. Coordination normal. GCS eye subscore is 4. GCS verbal subscore is 5. GCS motor subscore is 6.  Skin: Skin is warm, dry and intact. No rash noted. No cyanosis.  Psychiatric: She has a normal mood and affect. Her speech is normal and behavior is normal. Thought content normal.  Nursing  note and vitals reviewed.    ED Treatments / Results  Labs (all labs ordered are listed, but only abnormal results are displayed) Labs Reviewed  CBC WITH DIFFERENTIAL/PLATELET - Abnormal; Notable for the following components:      Result Value   WBC 13.8 (*)    Hemoglobin 11.9 (*)    Neutro Abs 9.7 (*)    All other components within normal limits  BASIC METABOLIC PANEL - Abnormal; Notable for the following components:   Glucose, Bld 129 (*)    BUN 22 (*)    Calcium 8.8 (*)    All other components within normal limits  PROTIME-INR    EKG  EKG Interpretation None       Radiology No results found.  Procedures .Epistaxis Management Date/Time: 09/03/2017 6:20 AM Performed by: Gilda CreasePollina, Christopher J, MD Authorized by: Gilda CreasePollina, Christopher J, MD   Consent:    Consent obtained:  Verbal   Consent given by:  Patient   Risks discussed:  Bleeding, nasal injury and pain Universal protocol:    Procedure explained and questions answered to patient or proxy's satisfaction: yes     Site/side marked: yes     Time out called: yes     Patient identity confirmed:  Verbally with patient Anesthesia (see MAR for exact dosages):    Anesthesia method:  None Procedure details:    Treatment site:  R anterior   Treatment method:  Nasal balloon (5.5cm)   Treatment episode: recurring   Post-procedure details:    Assessment:  Bleeding decreased   Patient tolerance of procedure:  Tolerated well, no immediate complications  .Epistaxis Management Date/Time: 09/03/2017 7:12 AM Performed by: Gilda CreasePollina, Christopher J, MD Authorized by: Gilda CreasePollina, Christopher J, MD   Consent:    Consent obtained:  Verbal   Consent given by:  Patient   Risks discussed:  Pain, nasal injury and bleeding Universal protocol:    Procedure explained and questions answered to patient or proxy's satisfaction: yes     Site/side marked: yes     Time out called: yes     Patient identity confirmed:  Verbally with  patient Procedure details:    Treatment site:  R anterior   Treatment method:  Nasal balloon (7.5cm)   Treatment episode: recurring   Post-procedure details:    Assessment:  Bleeding stopped   Patient tolerance of procedure:  Tolerated well, no immediate complications   (including critical care time)  Medications Ordered in ED Medications  tranexamic acid (CYKLOKAPRON) injection 500 mg (500 mg Topical Given 09/03/17 0340)  labetalol (NORMODYNE,TRANDATE) injection 10 mg (10 mg Intravenous Given 09/03/17 0436)  HYDROmorphone (DILAUDID) injection 0.5 mg (0.5 mg Intravenous Given 09/03/17 0628)  ondansetron (ZOFRAN) injection 4 mg (4 mg Intravenous Given 09/03/17 0628)  HYDROmorphone (DILAUDID) injection 1 mg (  1 mg Intravenous Given 09/03/17 0708)     Initial Impression / Assessment and Plan / ED Course  I have reviewed the triage vital signs and the nursing notes.  Pertinent labs & imaging results that were available during my care of the patient were reviewed by me and considered in my medical decision making (see chart for details).     She returned for recurrent bleeding.  It appeared that the balloon had partially come out of the nose and the bleeding was going around the balloon and up and over to the other side of her nose.  I recommended repacking.  The initial packing looked like a 4.5 cm balloon.  I set a longer 5.5 cm balloon was utilized.  This was soaked in tranexamic acid.  Patient continued to have bleeding.  She was given labetalol for her blood pressure which improved.  Continues to have small amounts of oozing of blood from the right side but also bleeding out of the left.  This prompts consideration for possible posterior bleed, recommended repacking with an anterior posterior balloon.  The 5.5 cm balloon was removed and a 7.5 cm balloon was placed and inflated.  This seems to be slowing the bleeding.  Will monitor for recurrent bleeding.  Will sign off to oncoming ER physician  to watch.  If there is no or minimal bleeding, can discharge and follow-up with ENT.  Final Clinical Impressions(s) / ED Diagnoses   Final diagnoses:  Epistaxis    ED Discharge Orders    None       Gilda Crease, MD 09/03/17 [phone]

## 2017-09-26 ENCOUNTER — Ambulatory Visit: Payer: BLUE CROSS/BLUE SHIELD | Admitting: Pediatrics

## 2017-10-01 ENCOUNTER — Encounter: Payer: Self-pay | Admitting: Pediatrics

## 2017-10-29 ENCOUNTER — Other Ambulatory Visit: Payer: Self-pay

## 2017-10-29 ENCOUNTER — Emergency Department (HOSPITAL_COMMUNITY): Payer: Self-pay

## 2017-10-29 ENCOUNTER — Encounter (HOSPITAL_COMMUNITY): Payer: Self-pay | Admitting: Emergency Medicine

## 2017-10-29 ENCOUNTER — Emergency Department (HOSPITAL_COMMUNITY)
Admission: EM | Admit: 2017-10-29 | Discharge: 2017-10-29 | Disposition: A | Discharge status: Home / Self Care | Payer: Self-pay | Attending: Emergency Medicine | Admitting: Emergency Medicine

## 2017-10-29 DIAGNOSIS — I10 Essential (primary) hypertension: Secondary | ICD-10-CM | POA: Insufficient documentation

## 2017-10-29 DIAGNOSIS — Z79899 Other long term (current) drug therapy: Secondary | ICD-10-CM | POA: Insufficient documentation

## 2017-10-29 DIAGNOSIS — F17210 Nicotine dependence, cigarettes, uncomplicated: Secondary | ICD-10-CM | POA: Insufficient documentation

## 2017-10-29 DIAGNOSIS — J189 Pneumonia, unspecified organism: Secondary | ICD-10-CM | POA: Insufficient documentation

## 2017-10-29 LAB — CBC WITH DIFFERENTIAL/PLATELET
BASOS ABS: 0.1 10*3/uL (ref 0.0–0.1)
Basophils Relative: 0 %
EOS ABS: 0.2 10*3/uL (ref 0.0–0.7)
EOS PCT: 1 %
HEMATOCRIT: 33.8 % — AB (ref 36.0–46.0)
Hemoglobin: 10.3 g/dL — ABNORMAL LOW (ref 12.0–15.0)
LYMPHS ABS: 1.6 10*3/uL (ref 0.7–4.0)
LYMPHS PCT: 11 %
MCH: 25.2 pg — ABNORMAL LOW (ref 26.0–34.0)
MCHC: 30.5 g/dL (ref 30.0–36.0)
MCV: 82.8 fL (ref 78.0–100.0)
MONOS PCT: 9 %
Monocytes Absolute: 1.3 10*3/uL — ABNORMAL HIGH (ref 0.1–1.0)
NEUTROS PCT: 79 %
Neutro Abs: 11.3 10*3/uL — ABNORMAL HIGH (ref 1.7–7.7)
PLATELETS: 292 10*3/uL (ref 150–400)
RBC: 4.08 MIL/uL (ref 3.87–5.11)
RDW: 15 % (ref 11.5–15.5)
WBC: 14.4 10*3/uL — ABNORMAL HIGH (ref 4.0–10.5)

## 2017-10-29 LAB — BASIC METABOLIC PANEL
Anion gap: 8 (ref 5–15)
BUN: 10 mg/dL (ref 6–20)
CHLORIDE: 101 mmol/L (ref 101–111)
CO2: 26 mmol/L (ref 22–32)
CREATININE: 0.7 mg/dL (ref 0.44–1.00)
Calcium: 8.7 mg/dL — ABNORMAL LOW (ref 8.9–10.3)
GFR calc Af Amer: >60 mL/min (ref >60)
GFR calc non Af Amer: >60 mL/min (ref >60)
Glucose, Bld: 111 mg/dL — ABNORMAL HIGH (ref 65–99)
POTASSIUM: 3.9 mmol/L (ref 3.5–5.1)
Sodium: 135 mmol/L (ref 135–145)

## 2017-10-29 MED ORDER — DOXYCYCLINE HYCLATE 100 MG PO CAPS: 100.0000 mg | ORAL_CAPSULE | Freq: Two times a day (BID) | ORAL | 0 refills | Status: AC

## 2017-10-29 MED ORDER — ALBUTEROL SULFATE HFA 108 (90 BASE) MCG/ACT IN AERS: 1.0000 | INHALATION_SPRAY | Freq: Four times a day (QID) | RESPIRATORY_TRACT | 0 refills | Status: DC | PRN

## 2017-10-29 MED ORDER — ALBUTEROL SULFATE HFA 108 (90 BASE) MCG/ACT IN AERS
2.0000 | INHALATION_SPRAY | Freq: Once | RESPIRATORY_TRACT | Status: AC
  Administered 2017-10-29: 2 via RESPIRATORY_TRACT
  Filled 2017-10-29: qty 6.7

## 2017-10-29 MED ORDER — SODIUM CHLORIDE 0.9 % IV BOLUS
1000.0000 mL | Freq: Once | INTRAVENOUS | Status: AC
Start: 2017-10-29 — End: 2017-10-29
  Administered 2017-10-29: 1000 mL via INTRAVENOUS

## 2017-10-29 NOTE — Discharge Instructions (Signed)
We believe that your symptoms are caused today by pneumonia, an infection in your lung(s).  Fortunately you should start to improve quickly after taking your antibiotics.  Please take the full course of antibiotics as prescribed and drink plenty of fluids.   ° °Follow up with your doctor within 1-2 days.  If you develop any new or worsening symptoms, including but not limited to fever in spite of taking over-the-counter ibuprofen and/or Tylenol, persistent vomiting, worsening shortness of breath, or other symptoms that concern you, please return to the Emergency Department immediately.  ° ° °Pneumonia °Pneumonia is an infection of the lungs.  °CAUSES °Pneumonia may be caused by bacteria or a virus. Usually, these infections are caused by breathing infectious particles into the lungs (respiratory tract). °SIGNS AND SYMPTOMS  °Cough. °Fever. °Chest pain. °Increased rate of breathing. °Wheezing. °Mucus production. °DIAGNOSIS  °If you have the common symptoms of pneumonia, your health care provider will typically confirm the diagnosis with a chest X-ray. The X-ray will show an abnormality in the lung (pulmonary infiltrate) if you have pneumonia. Other tests of your blood, urine, or sputum may be done to find the specific cause of your pneumonia. Your health care provider may also do tests (blood gases or pulse oximetry) to see how well your lungs are working. °TREATMENT  °Some forms of pneumonia may be spread to other people when you cough or sneeze. You may be asked to wear a mask before and during your exam. Pneumonia that is caused by bacteria is treated with antibiotic medicine. Pneumonia that is caused by the influenza virus may be treated with an antiviral medicine. Most other viral infections must run their course. These infections will not respond to antibiotics.  °HOME CARE INSTRUCTIONS  °Cough suppressants may be used if you are losing too much rest. However, coughing protects you by clearing your lungs. You  should avoid using cough suppressants if you can. °Your health care provider may have prescribed medicine if he or she thinks your pneumonia is caused by bacteria or influenza. Finish your medicine even if you start to feel better. °Your health care provider may also prescribe an expectorant. This loosens the mucus to be coughed up. °Take medicines only as directed by your health care provider. °Do not smoke. Smoking is a common cause of bronchitis and can contribute to pneumonia. If you are a smoker and continue to smoke, your cough may last several weeks after your pneumonia has cleared. °A cold steam vaporizer or humidifier in your room or home may help loosen mucus. °Coughing is often worse at night. Sleeping in a semi-upright position in a recliner or using a couple pillows under your head will help with this. °Get rest as you feel it is needed. Your body will usually let you know when you need to rest. °PREVENTION °A pneumococcal shot (vaccine) is available to prevent a common bacterial cause of pneumonia. This is usually suggested for: °People over 65 years old. °Patients on chemotherapy. °People with chronic lung problems, such as bronchitis or emphysema. °People with immune system problems. °If you are over 65 or have a high risk condition, you may receive the pneumococcal vaccine if you have not received it before. In some countries, a routine influenza vaccine is also recommended. This vaccine can help prevent some cases of pneumonia. You may be offered the influenza vaccine as part of your care. °If you smoke, it is time to quit. You may receive instructions on how to stop smoking. Your   health care provider can provide medicines and counseling to help you quit. °SEEK MEDICAL CARE IF: °You have a fever. °SEEK IMMEDIATE MEDICAL CARE IF:  °Your illness becomes worse. This is especially true if you are elderly or weakened from any other disease. °You cannot control your cough with suppressants and are losing  sleep. °You begin coughing up blood. °You develop pain which is getting worse or is uncontrolled with medicines. °Any of the symptoms which initially brought you in for treatment are getting worse rather than better. °You develop shortness of breath or chest pain. °MAKE SURE YOU:  °Understand these instructions. °Will watch your condition. °Will get help right away if you are not doing well or get worse. °Document Released: 06/11/2005 Document Revised: 10/26/2013 Document Reviewed: 08/31/2010 °Exit[employer]® Patient Information ©2015 [employer]. This information is not intended to replace advice given to you by your health care provider. Make sure you discuss any questions you have with your health care provider. ° ° ° °

## 2017-10-29 NOTE — ED Notes (Signed)
Pt transported to xray 

## 2017-10-29 NOTE — ED Notes (Signed)
Pt returned from xray

## 2017-10-29 NOTE — ED Provider Notes (Signed)
Emergency Department Provider Note   I have reviewed the triage vital signs and the nursing notes.   HISTORY  Chief Complaint Cough   HPI Kathleen Perez is a 42 y.o. female presents to the emergency department for evaluation of productive cough with fever and mild dyspnea.  The patient's symptoms have worsened over the last 2 to 3 days.  She went to urgent care yesterday and was diagnosed with a viral infection.  She has been continuing supportive care at home with no improvement in symptoms.  She returns today with concern that she may have pneumonia.  She did appreciate some faint, blood-tinged sputum.  She has also had shaking chills.  No abdominal pain, vomiting, diarrhea.  Past Medical History:  Diagnosis Date  . Hypertension   . Kidney stone   . MRSA (methicillin resistant staph aureus) culture positive   . Scoliosis   . Urinary tract infection     There are no active problems to display for this patient.   Past Surgical History:  Procedure Laterality Date  . CYSTECTOMY    . KIDNEY STONE SURGERY      Current Outpatient Rx  . Order #: [id number] Class: Print  . Order #: [id number] Class: Print  . Order #: [id number] Class: Print  . Order #: [id number] Class: Historical Med  . Order #: [id number] Class: Print  . Order #: [id number] Class: Print  . Order #: [id number] Class: Historical Med    Allergies Patient has no known allergies.  No family history on file.  Social History Social History   Tobacco Use  . Smoking status: Current Every Day Smoker    Packs/day: 1.00    Types: Cigarettes  . Smokeless tobacco: Former Engineer, water Use Topics  . Alcohol use: No  . Drug use: No    Review of Systems  Constitutional: Positive fever/chills Eyes: No visual changes. ENT: No sore throat. Cardiovascular: Denies chest pain. Respiratory: Positive shortness of breath and cough.  Gastrointestinal: No abdominal pain.  No nausea, no vomiting.  No diarrhea.  No  constipation. Genitourinary: Negative for dysuria. Musculoskeletal: Negative for back pain. Skin: Negative for rash. Neurological: Negative for headaches, focal weakness or numbness.  10-point ROS otherwise negative.  ____________________________________________   PHYSICAL EXAM:  VITAL SIGNS: ED Triage Vitals  Enc Vitals Group     BP 10/29/17 0800 (!) 152/83     Pulse Rate 10/29/17 0800 (!) 107     Resp 10/29/17 0800 20     Temp 10/29/17 0800 98.8 F (37.1 C)     Temp Source 10/29/17 0800 Oral     SpO2 10/29/17 0800 93 %     Weight 10/29/17 0801 203 lb (92.1 kg)     Height 10/29/17 0801  (1.676 m)     Pain Score 10/29/17 0800 5   Constitutional: Alert and oriented. Well appearing and in no acute distress. Eyes: Conjunctivae are normal.  Head: Atraumatic. Nose: No congestion/rhinnorhea. Mouth/Throat: Mucous membranes are slightly dry.  Neck: No stridor.  Cardiovascular: Sinus tachycardia. Good peripheral circulation. Grossly normal heart sounds.   Respiratory: Slight increased respiratory effort.  No retractions. Lungs CTAB. Gastrointestinal: Soft and nontender. No distention.  Musculoskeletal: No lower extremity tenderness nor edema. No gross deformities of extremities. Neurologic:  Normal speech and language. No gross focal neurologic deficits are appreciated.  Skin:  Skin is warm, dry and intact. No rash noted.  ____________________________________________   LABS (all labs ordered are listed, but only abnormal results are displayed)  Labs Reviewed  BASIC METABOLIC PANEL - Abnormal; Notable for the following components:      Result Value   Glucose, Bld 111 (*)    Calcium 8.7 (*)    All other components within normal limits  CBC WITH DIFFERENTIAL/PLATELET - Abnormal; Notable for the following components:   WBC 14.4 (*)    Hemoglobin 10.3 (*)    HCT 33.8 (*)    MCH 25.2 (*)    Neutro Abs 11.3 (*)    Monocytes Absolute 1.3 (*)    All other components  within normal limits   ____________________________________________  RADIOLOGY  Dg Chest 2 View  Result Date: 10/29/2017 CLINICAL DATA:  Cough and fever for the past 2 days. Current smoker. Recently exposed to a patient with pneumonia. EXAM: CHEST - 2 VIEW COMPARISON:  PA and lateral chest x-ray of December 21, 2014 FINDINGS: There are patchy alveolar opacities bilaterally. There is infiltrate in the right lower lobe posteriorly. The lungs are well-expanded. There is no pleural effusion. The heart and pulmonary vascularity are normal. The mediastinum is normal in width. There is gentle curvature centered in the midthoracic spine convex toward the right. IMPRESSION: Findings compatible with bilateral pneumonia. Followup PA and lateral chest X-ray is recommended in 3-4 weeks following trial of antibiotic therapy to ensure resolution and exclude underlying malignancy. Electronically Signed   By: David  [place] M.D.   On: 10/29/2017 08:49    ____________________________________________   PROCEDURES  Procedure(s) performed:   Procedures  None ____________________________________________   INITIAL IMPRESSION / ASSESSMENT AND PLAN / ED COURSE  Pertinent labs & imaging results that were available during my care of the patient were reviewed by me and considered in my medical decision making (see chart for details).  Patient presents to the ED for evaluation of SOB and fever. No hypoxemia but does have mild tachycardia. Afebrile here. CXR shows PNA bilaterally. Patient feeling overall well after IVF and albuterol. HR down-trending. Plan for outpatient abx and PCP follow up.   At this time, I do not feel there is any life-threatening condition present. I have reviewed and discussed all results (EKG, imaging, lab, urine as appropriate), exam findings with patient. I have reviewed nursing notes and appropriate previous records.  I feel the patient is safe to be discharged home without further emergent  workup. Discussed usual and customary return precautions. Patient and family (if present) verbalize understanding and are comfortable with this plan.  Patient will follow-up with their primary care provider. If they do not have a primary care provider, information for follow-up has been provided to them. All questions have been answered.  ____________________________________________  FINAL CLINICAL IMPRESSION(S) / ED DIAGNOSES  Final diagnoses:  Community acquired pneumonia, unspecified laterality     MEDICATIONS GIVEN DURING THIS VISIT:  Medications  sodium chloride 0.9 % bolus 1,000 mL (0 mLs Intravenous Stopped 10/29/17 1056)  albuterol (PROVENTIL HFA;VENTOLIN HFA) 108 (90 Base) MCG/ACT inhaler 2 puff (2 puffs Inhalation Given 10/29/17 1058)     NEW OUTPATIENT MEDICATIONS STARTED DURING THIS VISIT:  Discharge Medication List as of 10/29/2017 10:52 AM    START taking these medications   Details  albuterol (PROVENTIL HFA;VENTOLIN HFA) 108 (90 Base) MCG/ACT inhaler Inhale 1-2 puffs into the lungs every 6 (six) hours as needed for wheezing or shortness of breath., Starting Tue 10/29/2017, Print    doxycycline (VIBRAMYCIN) 100 MG capsule Take 1 capsule (100 mg total) by mouth 2 (two) times daily for 7 days., Starting Tue 10/29/2017, Until Tue 11/05/2017, Print  Note:  This document was prepared using Dragon voice recognition software and may include unintentional dictation errors.  Alona Bene, MD Emergency Medicine    Alda Gaultney, Arlyss Repress, MD 10/29/17 [phone]

## 2017-10-29 NOTE — ED Triage Notes (Signed)
Pt c/o productive cough sometimes with blood tinged sputum, fever, and chills x 2 days. States she was evaluated at [facility] yesterday and dx with URI. Pt concerned she may have pneumonia. Pt took Tylenol PTA.

## 2017-10-31 ENCOUNTER — Other Ambulatory Visit: Payer: Self-pay

## 2017-10-31 ENCOUNTER — Emergency Department (HOSPITAL_COMMUNITY)
Admission: EM | Admit: 2017-10-31 | Discharge: 2017-10-31 | Disposition: A | Discharge status: Home / Self Care | Payer: Self-pay | Attending: Emergency Medicine | Admitting: Emergency Medicine

## 2017-10-31 ENCOUNTER — Encounter (HOSPITAL_COMMUNITY): Payer: Self-pay | Admitting: Emergency Medicine

## 2017-10-31 DIAGNOSIS — R531 Weakness: Secondary | ICD-10-CM | POA: Insufficient documentation

## 2017-10-31 DIAGNOSIS — F17210 Nicotine dependence, cigarettes, uncomplicated: Secondary | ICD-10-CM | POA: Insufficient documentation

## 2017-10-31 DIAGNOSIS — E860 Dehydration: Secondary | ICD-10-CM | POA: Insufficient documentation

## 2017-10-31 DIAGNOSIS — I10 Essential (primary) hypertension: Secondary | ICD-10-CM | POA: Insufficient documentation

## 2017-10-31 LAB — BASIC METABOLIC PANEL
Anion gap: 6 (ref 5–15)
BUN: 12 mg/dL (ref 6–20)
CO2: 28 mmol/L (ref 22–32)
Calcium: 8.9 mg/dL (ref 8.9–10.3)
Chloride: 107 mmol/L (ref 101–111)
Creatinine, Ser: 0.58 mg/dL (ref 0.44–1.00)
GFR calc Af Amer: >60 mL/min (ref >60)
GFR calc non Af Amer: >60 mL/min (ref >60)
Glucose, Bld: 97 mg/dL (ref 65–99)
Potassium: 4.3 mmol/L (ref 3.5–5.1)
Sodium: 141 mmol/L (ref 135–145)

## 2017-10-31 LAB — CBC WITH DIFFERENTIAL/PLATELET
Basophils Absolute: 0.1 10*3/uL (ref 0.0–0.1)
Basophils Relative: 1 %
Eosinophils Absolute: 0.8 10*3/uL — ABNORMAL HIGH (ref 0.0–0.7)
Eosinophils Relative: 6 %
HCT: 32.4 % — ABNORMAL LOW (ref 36.0–46.0)
Hemoglobin: 9.7 g/dL — ABNORMAL LOW (ref 12.0–15.0)
Lymphocytes Relative: 20 %
Lymphs Abs: 2.3 10*3/uL (ref 0.7–4.0)
MCH: 25 pg — ABNORMAL LOW (ref 26.0–34.0)
MCHC: 29.9 g/dL — ABNORMAL LOW (ref 30.0–36.0)
MCV: 83.5 fL (ref 78.0–100.0)
Monocytes Absolute: 1.2 10*3/uL — ABNORMAL HIGH (ref 0.1–1.0)
Monocytes Relative: 10 %
Neutro Abs: 7.6 10*3/uL (ref 1.7–7.7)
Neutrophils Relative %: 63 %
Platelets: 347 10*3/uL (ref 150–400)
RBC: 3.88 MIL/uL (ref 3.87–5.11)
RDW: 15.2 % (ref 11.5–15.5)
WBC: 12 10*3/uL — ABNORMAL HIGH (ref 4.0–10.5)

## 2017-10-31 MED ORDER — SODIUM CHLORIDE 0.9 % IV BOLUS
1000.0000 mL | Freq: Once | INTRAVENOUS | Status: AC
  Administered 2017-10-31: 1000 mL via INTRAVENOUS

## 2017-10-31 MED ORDER — ONDANSETRON 4 MG PO TBDP: 4.0000 mg | ORAL_TABLET | Freq: Three times a day (TID) | ORAL | 0 refills | Status: DC | PRN

## 2017-10-31 NOTE — ED Triage Notes (Signed)
Pt was diagnosed with pneumonia x 3 days ago and when she went to work today she felt very weak and broke out in a sweat

## 2017-10-31 NOTE — ED Provider Notes (Signed)
Emergency Department Provider Note   I have reviewed the triage vital signs and the nursing notes.   HISTORY  Chief Complaint Weakness   HPI Kathleen Perez is a 42 y.o. female with PMH of HTN and prior MRSA infection presents to the emergency department for evaluation of continued fatigue and sweats today.  The patient was diagnosed with pneumonia 2 days prior and started on doxycycline.  She is been compliant with this antibiotic and states that overall she began to feel better.  She states she is not drinking much fluid and "I think I need another bag of fluid and a note to be out of work for the next 3 days."  She states that her coughing, shortness of breath symptoms have improved since starting antibiotics.  Her husband is also being treated for pneumonia along with her daughter.  Patient denies any vomiting or diarrhea.  She states when she returned to work today began having sweats and some fatigue she felt she needed more time to rest and help her other sick family members. No radiation of symptoms or modifying factors.    Past Medical History:  Diagnosis Date  . Hypertension   . Kidney stone   . MRSA (methicillin resistant staph aureus) culture positive   . Scoliosis   . Urinary tract infection     There are no active problems to display for this patient.   Past Surgical History:  Procedure Laterality Date  . CYSTECTOMY    . KIDNEY STONE SURGERY      Current Outpatient Rx  . Order #: [id number] Class: Historical Med  . Order #: [id number] Class: Print  . Order #: [id number] Class: Print  . Order #: [id number] Class: Historical Med  . Order #: [id number] Class: Print  . Order #: [id number] Class: Print  . Order #: [id number] Class: Historical Med  . Order #: [id number] Class: Print    Allergies Patient has no known allergies.  No family history on file.  Social History Social History   Tobacco Use  . Smoking status: Current Every Day Smoker    Packs/day: 1.00   Types: Cigarettes  . Smokeless tobacco: Former Engineer, water Use Topics  . Alcohol use: No  . Drug use: No    Review of Systems  Constitutional: No fever/chills. Positive fatigue and sweats today. No rigors.  Eyes: No visual changes. ENT: No sore throat. Cardiovascular: Denies chest pain. Respiratory: Denies shortness of breath. Gastrointestinal: No abdominal pain.  No nausea, no vomiting.  No diarrhea.  No constipation. Genitourinary: Negative for dysuria. Musculoskeletal: Negative for back pain. Skin: Negative for rash. Neurological: Negative for headaches, focal weakness or numbness.  10-point ROS otherwise negative.  ____________________________________________   PHYSICAL EXAM:  VITAL SIGNS: ED Triage Vitals  Enc Vitals Group     BP 10/31/17 1144 129/85     Pulse Rate 10/31/17 1144 87     Resp 10/31/17 1144 16     Temp 10/31/17 1144 98.2 F (36.8 C)     Temp Source 10/31/17 1144 Oral     SpO2 10/31/17 1144 97 %     Pain Score 10/31/17 1145 5   Constitutional: Alert and oriented. Well appearing and in no acute distress. Eyes: Conjunctivae are normal.  Head: Atraumatic. Nose: No congestion/rhinnorhea. Mouth/Throat: Mucous membranes are slightly dry.  Neck: No stridor.  Cardiovascular: Normal rate, regular rhythm. Good peripheral circulation. Grossly normal heart sounds.   Respiratory: Normal respiratory effort.  No retractions. Lungs with faint course sounds at the bases.  Gastrointestinal: Soft and nontender. No distention.  Musculoskeletal: No lower extremity tenderness nor edema. No gross deformities of extremities. Neurologic:  Normal speech and language. No gross focal neurologic deficits are appreciated.  Skin:  Skin is warm, dry and intact. No rash noted.   ____________________________________________   LABS (all labs ordered are listed, but only abnormal results are displayed)  Labs Reviewed  CBC WITH DIFFERENTIAL/PLATELET - Abnormal; Notable for  the following components:      Result Value   WBC 12.0 (*)    Hemoglobin 9.7 (*)    HCT 32.4 (*)    MCH 25.0 (*)    MCHC 29.9 (*)    Monocytes Absolute 1.2 (*)    Eosinophils Absolute 0.8 (*)    All other components within normal limits  BASIC METABOLIC PANEL   ____________________________________________  RADIOLOGY  None ____________________________________________   PROCEDURES  Procedure(s) performed:   Procedures  None ____________________________________________   INITIAL IMPRESSION / ASSESSMENT AND PLAN / ED COURSE  Pertinent labs & imaging results that were available during my care of the patient were reviewed by me and considered in my medical decision making (see chart for details).  Patient returns to the emergency department for evaluation of fatigue and sweats after returning to work today.  Patient was diagnosed with pneumonia 2 days ago and has been compliant with her doxycycline.  Her vital signs today are unremarkable with no hypoxemia or tachycardia.  Patient is very well-appearing with possible mild dehydration.  Her respiratory exam is unremarkable for acute changes.  Patient feels she needs some more time off of work and IV fluids which seem reasonable.  Her respiratory symptoms seem to be improving from when I saw her 2 days ago.  I do not think a change in antibiotic is warranted at this time.  With largely unremarkable exam and vital signs I do not feel that repeat chest x-ray is warranted either.  Plan for IV fluids, baseline labs and reassess.  01:35 PM The patient's white blood cell count is downtrending from prior lab values.  Remaining labs reviewed with no acute findings.  She reports feeling much better after IV fluid bolus.  She denies any shortness of breath or chest pain.  EKG reviewed with no acute findings.  Will discharge home with prescription for Zofran with some intermittent nausea that the patient's been having.  She will continue her  doxycycline and follow-up with her primary care physician.  I provided a work note to allow more time for recuperation at home.   At this time, I do not feel there is any life-threatening condition present. I have reviewed and discussed all results (EKG, imaging, lab, urine as appropriate), exam findings with patient. I have reviewed nursing notes and appropriate previous records.  I feel the patient is safe to be discharged home without further emergent workup. Discussed usual and customary return precautions. Patient and family (if present) verbalize understanding and are comfortable with this plan.  Patient will follow-up with their primary care provider. If they do not have a primary care provider, information for follow-up has been provided to them. All questions have been answered.  ____________________________________________  FINAL CLINICAL IMPRESSION(S) / ED DIAGNOSES  Final diagnoses:  Dehydration  Generalized weakness     MEDICATIONS GIVEN DURING THIS VISIT:  Medications  sodium chloride 0.9 % bolus 1,000 mL (0 mLs Intravenous Stopped 10/31/17 1333)     NEW OUTPATIENT MEDICATIONS STARTED DURING THIS VISIT:  New Prescriptions   ONDANSETRON (ZOFRAN  ODT) 4 MG DISINTEGRATING TABLET    Take 1 tablet (4 mg total) by mouth every 8 (eight) hours as needed for nausea or vomiting.    Note:  This document was prepared using Dragon voice recognition software and may include unintentional dictation errors.  Alona Bene, MD Emergency Medicine    Niley Helbig, Arlyss Repress, MD 10/31/17 1336

## 2017-10-31 NOTE — Discharge Instructions (Signed)
As we discussed, we believe her symptoms are caused today by mild volume depletion, or mild dehydration, without any evidence of damage to your body.  Please drink plenty of clear fluids such as water and/or Gatorade and follow up with your regular doctor or the doctors listed in his documentation at the next available opportunity.  Return to the emergency department with any new or worsening symptoms that concern you, including but not limited to fever, shortness of breath, chest pain, or other concerning symptoms. ° ° °Dehydration, Adult °Dehydration is when you lose more fluids from the body than you take in. Vital organs like the kidneys, brain, and heart cannot function without a proper amount of fluids and salt. Any loss of fluids from the body can cause dehydration.  °CAUSES  °Vomiting. °Diarrhea. °Excessive sweating. °Excessive urine output. °Fever. °SYMPTOMS  °Mild dehydration °Thirst. °Dry lips. °Slightly dry mouth. °Moderate dehydration °Very dry mouth. °Sunken eyes. °Skin does not bounce back quickly when lightly pinched and released. °Dark urine and decreased urine production. °Decreased tear production. °Headache. °Severe dehydration °Very dry mouth. °Extreme thirst. °Rapid, weak pulse (more than 100 beats per minute at rest). °Cold hands and feet. °Not able to sweat in spite of heat and temperature. °Rapid breathing. °Blue lips. °Confusion and lethargy. °Difficulty being awakened. °Minimal urine production. °No tears. °DIAGNOSIS  °Your caregiver will diagnose dehydration based on your symptoms and your exam. Blood and urine tests will help confirm the diagnosis. The diagnostic evaluation should also identify the cause of dehydration. °TREATMENT  °Treatment of mild or moderate dehydration can often be done at home by increasing the amount of fluids that you drink. It is best to drink small amounts of fluid more often. Drinking too much at one time can make vomiting worse. Refer to the home care  instructions below. °Severe dehydration needs to be treated at the hospital where you will probably be given intravenous (IV) fluids that contain water and electrolytes. °HOME CARE INSTRUCTIONS  °Ask your caregiver about specific rehydration instructions. °Drink enough fluids to keep your urine clear or pale yellow. °Drink small amounts frequently if you have nausea and vomiting. °Eat as you normally do. °Avoid: °Foods or drinks high in sugar. °Carbonated drinks. °Juice. °Extremely hot or cold fluids. °Drinks with caffeine. °Fatty, greasy foods. °Alcohol. °Tobacco. °Overeating. °Gelatin desserts. °Wash your hands well to avoid spreading bacteria and viruses. °Only take over-the-counter or prescription medicines for pain, discomfort, or fever as directed by your caregiver. °Ask your caregiver if you should continue all prescribed and over-the-counter medicines. °Keep all follow-up appointments with your caregiver. °SEEK MEDICAL CARE IF: °You have abdominal pain and it increases or stays in one area (localizes). °You have a rash, stiff neck, or severe headache. °You are irritable, sleepy, or difficult to awaken. °You are weak, dizzy, or extremely thirsty. °SEEK IMMEDIATE MEDICAL CARE IF:  °You are unable to keep fluids down or you get worse despite treatment. °You have frequent episodes of vomiting or diarrhea. °You have blood or green matter (bile) in your vomit. °You have blood in your stool or your stool looks black and tarry. °You have not urinated in 6 to 8 hours, or you have only urinated a small amount of very dark urine. °You have a fever. °You faint. °MAKE SURE YOU:  °Understand these instructions. °Will watch your condition. °Will get help right away if you are not doing well or get worse. °Document Released: 06/11/2005 Document Revised: 09/03/2011 Document Reviewed: 01/29/2011 °ExitCare® Patient Information ©2015   [employer]. This information is not intended to replace advice given to you by your health  care provider. Make sure you discuss any questions you have with your health care provider. ° °Rehydration, Adult °Rehydration is the replacement of body fluids lost during dehydration. Dehydration is an extreme loss of body fluids to the point of body function impairment. There are many ways extreme fluid loss can occur, including vomiting, diarrhea, or excess sweating. Recovering from dehydration requires replacing lost fluids, continuing to eat to maintain strength, and avoiding foods and beverages that may contribute to further fluid loss or may increase nausea. °HOW TO REHYDRATE °In most cases, rehydration involves the replacement of not only fluids but also carbohydrates and basic body salts. Rehydration with an oral rehydration solution is one way to replace essential nutrients lost through dehydration. °An oral rehydration solution can be purchased at pharmacies, retail stores, and online. Premixed packets of powder that you combine with water to make a solution are also sold. You can prepare an oral rehydration solution at home by mixing the following ingredients together:  ° - tsp table salt. °¾ tsp baking soda. ° tsp salt substitute containing potassium chloride. °1 tablespoons sugar. °1 L (34 oz) of water. °Be sure to use exact measurements. Including too much sugar can make diarrhea worse. °Drink ½-1 cup (120-240 mL) of oral rehydration solution each time you have diarrhea or vomit. If drinking this amount makes your vomiting worse, try drinking smaller amounts more often. For example, drink 1-3 tsp every 5-10 minutes.  °A general rule for staying hydrated is to drink 1½-2 L of fluid per day. Talk to your caregiver about the specific amount you should be drinking each day. Drink enough fluids to keep your urine clear or pale yellow. °EATING WHEN DEHYDRATED °Even if you have had severe sweating or you are having diarrhea, do not stop eating. Many healthy items in a normal diet are okay to continue eating  while recovering from dehydration. The following tips can help you to lessen nausea when you eat: °Ask someone else to prepare your food. Cooking smells may worsen nausea. °Eat in a well-ventilated room away from cooking smells. °Sit up when you eat. Avoid lying down until 1-2 hours after eating. °Eat small amounts when you eat. °Eat foods that are easy to digest. These include soft, well-cooked, or mashed foods. °FOODS AND BEVERAGES TO AVOID °Avoid eating or drinking the following foods and beverages that may increase nausea or further loss of fluid:  °Fruit juices with a high sugar content, such as concentrated juices. °Alcohol. °Beverages containing caffeine. °Carbonated drinks. They may cause a lot of gas. °Foods that may cause a lot of gas, such as cabbage, broccoli, and beans. °Fatty, greasy, and fried foods. °Spicy, very salty, and very sweet foods or drinks. °Foods or drinks that are very hot or very cold. Consume food or drinks at or near room temperature. °Foods that need a lot of chewing, such as raw vegetables. °Foods that are sticky or hard to swallow, such as peanut butter. °Document Released: 09/03/2011 Document Revised: 03/05/2012 Document Reviewed: 09/03/2011 °ExitCare® Patient Information ©2015 [employer]. This information is not intended to replace advice given to you by your health care provider. Make sure you discuss any questions you have with your health care provider. ° ° ° °

## 2017-11-01 ENCOUNTER — Encounter: Payer: Self-pay | Admitting: Family Medicine

## 2017-11-01 ENCOUNTER — Ambulatory Visit (INDEPENDENT_AMBULATORY_CARE_PROVIDER_SITE_OTHER): Payer: Self-pay | Admitting: Family Medicine

## 2017-11-01 ENCOUNTER — Telehealth: Payer: Self-pay | Admitting: Pediatrics

## 2017-11-01 VITALS — BP 115/67 | HR 84 | Temp 98.8°F | Ht 66.0 in | Wt 205.1 lb

## 2017-11-01 DIAGNOSIS — J189 Pneumonia, unspecified organism: Secondary | ICD-10-CM

## 2017-11-01 DIAGNOSIS — E860 Dehydration: Secondary | ICD-10-CM

## 2017-11-01 NOTE — Progress Notes (Signed)
Chief Complaint  Patient presents with  . Pneumonia    pt here today due to pneumonia and she needs a note for work excusing her for 10/31/17 through 11/03/17    HPI  Patient presents today for recheck of her pneumonia.  She was dehydrated and went to the emergency room yesterday and was given IV fluids.  Since then she has felt much better.  Today she needs a note to return to work after the weekend which would be Monday 5/13 2019.  She still feels rather weak but is much better.  Her cough is better.  She is drinking fluids including water and tea.  She would like to at least try returning to work. PMH: Smoking status noted ROS: Per HPI  Objective: BP 115/67   Pulse 84   Temp 98.8 F (37.1 C) (Oral)   Ht  (1.676 m)   Wt 205 lb 2 oz (93 kg)   BMI 33.11 kg/m  Gen: NAD, alert, cooperative with exam HEENT: NCAT, EOMI, PERRL CV: RRR, good S1/S2, no murmur Resp: CTABL, no wheezes, non-labored Abd: SNTND, BS present, no guarding or organomegaly Ext: No edema, warm Neuro: Alert and oriented, No gross deficits  Assessment and plan:  1. Pneumonia due to infectious organism, unspecified laterality, unspecified part of lung   2. Dehydration       Continue to rest at home today in the next 2 days.  After that she can attempt to resume work.  Cautioned her against overexerting herself and risking relapse.  Patient states understanding and agreement.  Follow up as needed.  Mechele Claude, MD

## 2017-11-01 NOTE — Telephone Encounter (Signed)
appt made for ED follow up

## 2017-11-04 ENCOUNTER — Ambulatory Visit: Payer: BLUE CROSS/BLUE SHIELD | Admitting: Pediatrics

## 2018-03-13 ENCOUNTER — Encounter (HOSPITAL_COMMUNITY): Payer: Self-pay | Admitting: Student

## 2018-03-13 ENCOUNTER — Other Ambulatory Visit: Payer: Self-pay

## 2018-03-13 ENCOUNTER — Emergency Department (HOSPITAL_COMMUNITY)
Admission: EM | Admit: 2018-03-13 | Discharge: 2018-03-13 | Disposition: A | Discharge status: Home / Self Care | Payer: Self-pay | Attending: Emergency Medicine | Admitting: Emergency Medicine

## 2018-03-13 DIAGNOSIS — L739 Follicular disorder, unspecified: Secondary | ICD-10-CM | POA: Insufficient documentation

## 2018-03-13 DIAGNOSIS — I10 Essential (primary) hypertension: Secondary | ICD-10-CM | POA: Insufficient documentation

## 2018-03-13 DIAGNOSIS — Z79899 Other long term (current) drug therapy: Secondary | ICD-10-CM | POA: Insufficient documentation

## 2018-03-13 DIAGNOSIS — F17210 Nicotine dependence, cigarettes, uncomplicated: Secondary | ICD-10-CM | POA: Insufficient documentation

## 2018-03-13 MED ORDER — DOXYCYCLINE HYCLATE 100 MG PO CAPS: 100.0000 mg | ORAL_CAPSULE | Freq: Two times a day (BID) | ORAL | 0 refills | Status: DC

## 2018-03-13 NOTE — Discharge Instructions (Addendum)
You were seen in the emergency department today due to concern for possible abscesses to the vaginal area.  Your physical exam is reassuring at this time.  We would like you to try at least 48 hours of applying warm compresses (15 minutes, 4-6 times per day) to each of these areas.  Should this be unsuccessful, or you have significantly worsening symptoms, please start the doxycycline antibiotic prescribed to you.  If taking the antibiotic is necessary, you may develop abdominal discomfort or diarrhea from the antibiotic.  You may help offset this with probiotics which you can buy at the store (ask your pharmacist if unable to find) or get probiotics in the form of eating yogurt. Do not eat or take the probiotics until 2 hours after your antibiotic. If you are unable to tolerate these side effects follow-up with your primary care provider or return to the emergency department.   If you begin to experience any blistering, rashes, swelling, or difficulty breathing seek medical care for evaluation of potentially more serious side effects.   Please be aware that this medication may interact with other medications you are taking, please be sure to discuss your medication list with your pharmacist. If you are taking birth control the antibiotic will deactivate your birth control for 2 weeks. If on coumadin the antibiotic will effect your coumadin level.   We would like you to follow-up with your primary care provider within 1 week for reassessment of this problem.  Return to the ER sooner for new or worsening symptoms including but not limited to putting redness, increased pain, fever, abdominal discomfort, or any other concerns that you may have.

## 2018-03-13 NOTE — ED Provider Notes (Signed)
[facility] Provider Note   CSN: [id number] Arrival date & time: 03/13/18  [id number]     History   Chief Complaint No chief complaint on file.   HPI Kathleen Perez is a 42 y.o. female with a hx of tobacco abuse, HTN, and prior MRSA who presents to the ED with complaints of boils to pelvic/vaginal area x 1 week.  Patient states that she recently shaved her genital area, and since has developed 3-4 small areas of swelling/redness that are painful to touch.  She states this is happened previously when she has shaved, she subsequently developed a large abscess which required incision and drainage as well as antibiotics.  None of those areas are quite this severe at this time, however she would very much like to avoid incision and drainage.  She is requesting antibiotics to help prevent progression.  She has not tried intervention at home.  There are no specific alleviating or aggravating factors.  Denies fever, nausea, vomiting, vaginal discharge, vaginal bleeding, or chance of pregnancy.  HPI  Past Medical History:  Diagnosis Date  . Hypertension   . Kidney stone   . MRSA (methicillin resistant staph aureus) culture positive   . Scoliosis   . Urinary tract infection     There are no active problems to display for this patient.   Past Surgical History:  Procedure Laterality Date  . CYSTECTOMY    . KIDNEY STONE SURGERY       OB History   None      Home Medications    Prior to Admission medications   Medication Sig Start Date End Date Taking? Authorizing Provider  acetaminophen (TYLENOL) 500 MG tablet Take 1,000 mg by mouth every 6 (six) hours as needed for mild pain or headache.    [provider]  albuterol (PROVENTIL HFA;VENTOLIN HFA) 108 (90 Base) MCG/ACT inhaler Inhale 1-2 puffs into the lungs every 6 (six) hours as needed for wheezing or shortness of breath. 10/29/17   Long, Arlyss RepressJoshua G, MD  amLODipine (NORVASC) 5 MG tablet Take 1 tablet (5 mg  total) by mouth daily. 09/03/17   Gilda CreasePollina, Christopher J, MD  Aspirin-Acetaminophen-Caffeine (GOODY HEADACHE PO) Take 2 Packages by mouth daily as needed.    [provider]  hydrochlorothiazide (HYDRODIURIL) 25 MG tablet Take 1 tablet (25 mg total) by mouth daily. 09/02/17   Mesner, Barbara CowerJason, MD  ibuprofen (ADVIL,MOTRIN) 200 MG tablet Take 800 mg by mouth every 6 (six) hours as needed for headache, mild pain or cramping.    [provider]  ondansetron (ZOFRAN ODT) 4 MG disintegrating tablet Take 1 tablet (4 mg total) by mouth every 8 (eight) hours as needed for nausea or vomiting. 10/31/17   Long, Arlyss RepressJoshua G, MD    Family History No family history on file.  Social History Social History   Tobacco Use  . Smoking status: Current Every Day Smoker    Packs/day: 1.00    Types: Cigarettes  . Smokeless tobacco: Former Engineer, waterUser  Substance Use Topics  . Alcohol use: No  . Drug use: No     Allergies   Patient has no known allergies.   Review of Systems Review of Systems  Constitutional: Negative for chills and fever.  Gastrointestinal: Negative for abdominal pain, nausea and vomiting.  Genitourinary: Negative for vaginal bleeding and vaginal discharge.  Skin: Positive for wound.     Physical Exam Updated Vital Signs There were no vitals taken for this visit.  Physical Exam  Constitutional: She appears well-developed and well-nourished. No distress.  HENT:  Head: Normocephalic and atraumatic.  Eyes: Conjunctivae are normal. Right eye exhibits no discharge. Left eye exhibits no discharge.  Abdominal: Soft. She exhibits no distension. There is no tenderness.  Genitourinary:    Pelvic exam was performed with patient supine.  Genitourinary Comments: Patient has three 0.5 to 1 cm areas of erythema, mild swelling, and tenderness- please see diagram for locations.  Each of these areas appear consistent with a hair follicle location.  There is no significant palpable fluctuance  or induration.  No streaking erythema. Chaperone present.   Neurological: She is alert.  Clear speech.   Psychiatric: She has a normal mood and affect. Her behavior is normal. Thought content normal.  Nursing note and vitals reviewed.    ED Treatments / Results  Labs (all labs ordered are listed, but only abnormal results are displayed) Labs Reviewed - No data to display  EKG None  Radiology No results found.  Procedures Procedures (including critical care time)  Medications Ordered in ED Medications - No data to display   Initial Impression / Assessment and Plan / ED Course  I have reviewed the triage vital signs and the nursing notes.  Pertinent labs & imaging results that were available during my care of the patient were reviewed by me and considered in my medical decision making (see chart for details).   Patient presents to the emergency department with concern for preventing significant abscess formation to the suprapubic/vaginal area.  She has 3 small areas that appear consistent with inflamed hair follicles, no significant fluctuance to indicate abscess at this time.  Do not feel that incision and drainage is necessary currently.  Discussed supportive measures at home including warm compresses with the patient.  She adamantly would like an antibiotic prescription, given she has a history of MRSA and has had a poor experience with I&D in the past we will give her a prescription for doxycycline, I recommended that she try 48 hours of warm compresses prior to starting antibiotic. I discussed treatment plan, need for PCP follow-up, and return precautions with the patient. Provided opportunity for questions, patient confirmed understanding and is in agreement with plan.   Final Clinical Impressions(s) / ED Diagnoses   Final diagnoses:  Inflammation of hair follicles    ED Discharge Orders         Ordered    doxycycline (VIBRAMYCIN) 100 MG capsule  2 times daily      03/13/18 [id number], Ruth R, PA-C 03/13/18 1020    Vanetta Mulders, MD 03/14/18 2010

## 2018-03-13 NOTE — ED Triage Notes (Signed)
Pt reports abscess to vaginal area after shaving. Pt states gets them when she shaves. Requesting antibiotic to prevent  Having to get it drained.

## 2018-06-09 ENCOUNTER — Encounter (HOSPITAL_COMMUNITY): Payer: Self-pay | Admitting: Emergency Medicine

## 2018-06-09 ENCOUNTER — Emergency Department (HOSPITAL_COMMUNITY)
Admission: EM | Admit: 2018-06-09 | Discharge: 2018-06-09 | Disposition: A | Discharge status: Home / Self Care | Payer: Self-pay | Attending: Emergency Medicine | Admitting: Emergency Medicine

## 2018-06-09 ENCOUNTER — Other Ambulatory Visit: Payer: Self-pay

## 2018-06-09 DIAGNOSIS — Z79899 Other long term (current) drug therapy: Secondary | ICD-10-CM | POA: Insufficient documentation

## 2018-06-09 DIAGNOSIS — I10 Essential (primary) hypertension: Secondary | ICD-10-CM | POA: Insufficient documentation

## 2018-06-09 DIAGNOSIS — Z760 Encounter for issue of repeat prescription: Secondary | ICD-10-CM | POA: Insufficient documentation

## 2018-06-09 DIAGNOSIS — F17210 Nicotine dependence, cigarettes, uncomplicated: Secondary | ICD-10-CM | POA: Insufficient documentation

## 2018-06-09 MED ORDER — HYDROCHLOROTHIAZIDE 25 MG PO TABS: 25.0000 mg | ORAL_TABLET | Freq: Every day | ORAL | 0 refills | Status: DC

## 2018-06-09 NOTE — ED Triage Notes (Signed)
Pt states she did not go to pcp for htn refill of hctz. Pt states her blood pressure at home was 185/110

## 2018-06-09 NOTE — ED Provider Notes (Signed)
[facility]   CSN: [id number] Arrival date & time: 06/09/18  2139     History   Chief Complaint Chief Complaint  Patient presents with  . Hypertension    HPI Kathleen Perez is a 42 y.o. female.  HPI Patient presents to the emergency room to get a refill of her blood pressure medications.  Patient has a history of hypertension.  Unfortunately she let her prescription run out a few days ago.  She has a follow-up appointment with her primary care doctor but it is not for a few days.  Patient was at home this evening and when she checked her blood pressure it was in the 180s systolic.  By the time she arrived here in the emergency room it did decrease down to 158.  Patient denies any trouble with any headache.  No chest pain or shortness of breath.  She denies any other complaints.  She is requesting a refill of her medication because she does not want to go without blood pressure medications for the next few days. Past Medical History:  Diagnosis Date  . Hypertension   . Kidney stone   . MRSA (methicillin resistant staph aureus) culture positive   . Scoliosis   . Urinary tract infection     There are no active problems to display for this patient.   Past Surgical History:  Procedure Laterality Date  . CYSTECTOMY    . KIDNEY STONE SURGERY       OB History   No obstetric history on file.      Home Medications    Prior to Admission medications   Medication Sig Start Date End Date Taking? Authorizing Provider  acetaminophen (TYLENOL) 500 MG tablet Take 1,000 mg by mouth every 6 (six) hours as needed for mild pain or headache.    [provider]  albuterol (PROVENTIL HFA;VENTOLIN HFA) 108 (90 Base) MCG/ACT inhaler Inhale 1-2 puffs into the lungs every 6 (six) hours as needed for wheezing or shortness of breath. 10/29/17   Long, Arlyss RepressJoshua G, MD  amLODipine (NORVASC) 5 MG tablet Take 1 tablet (5 mg total) by mouth daily. 09/03/17    Gilda CreasePollina, Christopher J, MD  Aspirin-Acetaminophen-Caffeine (GOODY HEADACHE PO) Take 2 Packages by mouth daily as needed.    [provider]  doxycycline (VIBRAMYCIN) 100 MG capsule Take 1 capsule (100 mg total) by mouth 2 (two) times daily. 03/13/18   Petrucelli, Samantha R, PA-C  hydrochlorothiazide (HYDRODIURIL) 25 MG tablet Take 1 tablet (25 mg total) by mouth daily. 06/09/18   Linwood DibblesKnapp, [place] Talcott, MD  ibuprofen (ADVIL,MOTRIN) 200 MG tablet Take 800 mg by mouth every 6 (six) hours as needed for headache, mild pain or cramping.    [provider]  ondansetron (ZOFRAN ODT) 4 MG disintegrating tablet Take 1 tablet (4 mg total) by mouth every 8 (eight) hours as needed for nausea or vomiting. 10/31/17   Long, Arlyss RepressJoshua G, MD    Family History History reviewed. No pertinent family history.  Social History Social History   Tobacco Use  . Smoking status: Current Every Day Smoker    Packs/day: 1.00    Types: Cigarettes  . Smokeless tobacco: Former Engineer, waterUser  Substance Use Topics  . Alcohol use: No  . Drug use: No     Allergies   Patient has no known allergies.   Review of Systems Review of Systems  All other systems reviewed and are negative.    Physical Exam Updated Vital Signs  BP (!) 158/83 (BP Location: Right Arm)   Pulse 74   Temp 98.2 F (36.8 C) (Oral)   Resp 17   Ht 1.702 m (5\' 7" )   Wt 92.1 kg   LMP 05/31/2018   SpO2 95%   BMI 31.79 kg/m   Physical Exam Vitals signs and nursing note reviewed.  Constitutional:      General: She is not in acute distress.    Appearance: She is well-developed.  HENT:     Head: Normocephalic and atraumatic.     Right Ear: External ear normal.     Left Ear: External ear normal.  Eyes:     General: No scleral icterus.       Right eye: No discharge.        Left eye: No discharge.     Conjunctiva/sclera: Conjunctivae normal.  Neck:     Musculoskeletal: Normal range of motion and neck supple.     Trachea: No tracheal deviation.    Cardiovascular:     Rate and Rhythm: Normal rate and regular rhythm.  Pulmonary:     Effort: Pulmonary effort is normal. No respiratory distress.     Breath sounds: Normal breath sounds. No stridor.  Abdominal:     General: There is no distension.  Skin:    General: Skin is warm and dry.     Findings: No rash.  Neurological:     General: No focal deficit present.     Mental Status: She is alert.     Cranial Nerves: Cranial nerve deficit: no gross deficits.      ED Treatments / Results   Procedures Procedures (including critical care time)  Medications Ordered in ED Medications - No data to display   Initial Impression / Assessment and Plan / ED Course  I have reviewed the triage vital signs and the nursing notes.  Pertinent labs & imaging results that were available during my care of the patient were reviewed by me and considered in my medical decision making (see chart for details).  Clinical Course as of Jun 09 2306  Mon Jun 09, 2018  2306 Blood pressure is down in the 110s systolic at the bedside   [JK]    Clinical Course User Index [JK] Linwood Dibbles, MD    Patient presented to the emergency room for a medication refill.  Patient's blood pressure crease down to the normal range without any intervention.  She is not having any focal symptoms to suggest any complications associated with her blood pressure.  I will give her a refill of her hydrochlorothiazide.  Patient already has plans to follow-up with her primary doctor.  Final Clinical Impressions(s) / ED Diagnoses   Final diagnoses:  Medication refill    ED Discharge Orders         Ordered    hydrochlorothiazide (HYDRODIURIL) 25 MG tablet  Daily     06/09/18 2305           Linwood Dibbles, MD 06/09/18 2309

## 2018-06-09 NOTE — Discharge Instructions (Signed)
Follow-up with your primary care doctor as planned. 

## 2018-10-29 ENCOUNTER — Telehealth: Payer: Self-pay | Admitting: Family Medicine

## 2019-01-15 ENCOUNTER — Encounter (HOSPITAL_COMMUNITY): Payer: Self-pay | Admitting: Emergency Medicine

## 2019-01-15 ENCOUNTER — Other Ambulatory Visit: Payer: Self-pay

## 2019-01-15 ENCOUNTER — Emergency Department (HOSPITAL_COMMUNITY): Payer: Self-pay

## 2019-01-15 ENCOUNTER — Emergency Department (HOSPITAL_COMMUNITY)
Admission: EM | Admit: 2019-01-15 | Discharge: 2019-01-15 | Disposition: A | Discharge status: Home / Self Care | Payer: Self-pay | Attending: Emergency Medicine | Admitting: Emergency Medicine

## 2019-01-15 DIAGNOSIS — S46911A Strain of unspecified muscle, fascia and tendon at shoulder and upper arm level, right arm, initial encounter: Secondary | ICD-10-CM | POA: Insufficient documentation

## 2019-01-15 DIAGNOSIS — S86911A Strain of unspecified muscle(s) and tendon(s) at lower leg level, right leg, initial encounter: Secondary | ICD-10-CM | POA: Insufficient documentation

## 2019-01-15 DIAGNOSIS — W19XXXA Unspecified fall, initial encounter: Secondary | ICD-10-CM

## 2019-01-15 DIAGNOSIS — F17210 Nicotine dependence, cigarettes, uncomplicated: Secondary | ICD-10-CM | POA: Insufficient documentation

## 2019-01-15 DIAGNOSIS — Z79899 Other long term (current) drug therapy: Secondary | ICD-10-CM | POA: Insufficient documentation

## 2019-01-15 DIAGNOSIS — Y998 Other external cause status: Secondary | ICD-10-CM | POA: Insufficient documentation

## 2019-01-15 DIAGNOSIS — W010XXA Fall on same level from slipping, tripping and stumbling without subsequent striking against object, initial encounter: Secondary | ICD-10-CM | POA: Insufficient documentation

## 2019-01-15 DIAGNOSIS — Y92018 Other place in single-family (private) house as the place of occurrence of the external cause: Secondary | ICD-10-CM | POA: Insufficient documentation

## 2019-01-15 DIAGNOSIS — Y9389 Activity, other specified: Secondary | ICD-10-CM | POA: Insufficient documentation

## 2019-01-15 DIAGNOSIS — I10 Essential (primary) hypertension: Secondary | ICD-10-CM | POA: Insufficient documentation

## 2019-01-15 MED ORDER — IBUPROFEN 800 MG PO TABS: 800.0000 mg | ORAL_TABLET | Freq: Three times a day (TID) | ORAL | 0 refills | Status: DC

## 2019-01-15 MED ORDER — CYCLOBENZAPRINE HCL 10 MG PO TABS: 10.0000 mg | ORAL_TABLET | Freq: Three times a day (TID) | ORAL | 0 refills | Status: DC | PRN

## 2019-01-15 MED ORDER — IBUPROFEN 800 MG PO TABS
800.0000 mg | ORAL_TABLET | Freq: Once | ORAL | Status: AC
  Administered 2019-01-15: 800 mg via ORAL
  Filled 2019-01-15: qty 1

## 2019-01-15 NOTE — ED Triage Notes (Signed)
Pt c/o tripping and falling in yard yesterday. Pt c/o pain to RT shoulder and behind RT calf. Pt ambulatory.

## 2019-01-15 NOTE — ED Provider Notes (Signed)
[facility] EMERGENCY DEPARTMENT Provider Note   CSN: [id number] Arrival date & time: 01/15/19  [id number]     History   Chief Complaint Chief Complaint  Patient presents with  . Fall    HPI Kathleen Perez is a 43 y.o. female.     HPI  Kathleen Perez is a 43 y.o. female who presents to the Emergency Department complaining of right shoulder blade pain in right calf pain secondary to a mechanical fall that occurred yesterday.  She states that she slipped and fell while cleaning her windows.  She complains of pain to her right calf and right shoulder blade area.  She describes the pain as sharp and pulsating to her shoulder blade area.  Her calf pain is improved from yesterday.  She denies head injury, LOC, headache or dizziness, numbness of the upper or lower extremities.  Pain to the shoulder and calf are worse with palpation and with movement.  Pain resolves when at rest.      Past Medical History:  Diagnosis Date  . Hypertension   . Kidney stone   . MRSA (methicillin resistant staph aureus) culture positive   . Scoliosis   . Urinary tract infection     There are no active problems to display for this patient.   Past Surgical History:  Procedure Laterality Date  . CYSTECTOMY    . KIDNEY STONE SURGERY       OB History   No obstetric history on file.      Home Medications    Prior to Admission medications   Medication Sig Start Date End Date Taking? Authorizing Provider  acetaminophen (TYLENOL) 500 MG tablet Take 1,000 mg by mouth every 6 (six) hours as needed for mild pain or headache.    [provider]  albuterol (PROVENTIL HFA;VENTOLIN HFA) 108 (90 Base) MCG/ACT inhaler Inhale 1-2 puffs into the lungs every 6 (six) hours as needed for wheezing or shortness of breath. 10/29/17   Long, Arlyss RepressJoshua G, MD  amLODipine (NORVASC) 5 MG tablet Take 1 tablet (5 mg total) by mouth daily. 09/03/17   Gilda CreasePollina, Christopher J, MD  Aspirin-Acetaminophen-Caffeine (GOODY HEADACHE  PO) Take 2 Packages by mouth daily as needed.    [provider]  doxycycline (VIBRAMYCIN) 100 MG capsule Take 1 capsule (100 mg total) by mouth 2 (two) times daily. 03/13/18   Petrucelli, Samantha R, PA-C  hydrochlorothiazide (HYDRODIURIL) 25 MG tablet Take 1 tablet (25 mg total) by mouth daily. 06/09/18   Linwood DibblesKnapp, Jon, MD  ibuprofen (ADVIL,MOTRIN) 200 MG tablet Take 800 mg by mouth every 6 (six) hours as needed for headache, mild pain or cramping.    [provider]  ondansetron (ZOFRAN ODT) 4 MG disintegrating tablet Take 1 tablet (4 mg total) by mouth every 8 (eight) hours as needed for nausea or vomiting. 10/31/17   Long, Arlyss RepressJoshua G, MD    Family History No family history on file.  Social History Social History   Tobacco Use  . Smoking status: Current Every Day Smoker    Packs/day: 1.00    Types: Cigarettes  . Smokeless tobacco: Former Engineer, waterUser  Substance Use Topics  . Alcohol use: No  . Drug use: No     Allergies   Patient has no known allergies.   Review of Systems Review of Systems  Constitutional: Negative for chills and fever.  Respiratory: Negative for chest tightness and shortness of breath.   Cardiovascular: Negative for chest pain.  Gastrointestinal: Negative for abdominal  pain, nausea and vomiting.  Genitourinary: Negative for difficulty urinating, dysuria and flank pain.  Musculoskeletal: Positive for arthralgias (Right shoulder blade) and myalgias (Right calf pain). Negative for back pain, joint swelling and neck pain.  Skin: Negative for color change and wound.  Neurological: Negative for dizziness, syncope, weakness, numbness and headaches.     Physical Exam Updated Vital Signs BP (!) 145/84 (BP Location: Right Arm)   Pulse 85   Temp 98.1 F (36.7 C) (Oral)   Resp 14   Ht 5\' 7"  (1.702 m)   Wt 92.1 kg   LMP 01/08/2019 (Approximate)   SpO2 98%   BMI 31.79 kg/m   Physical Exam Vitals signs and nursing note reviewed.  Constitutional:       General: She is not in acute distress.    Appearance: Normal appearance.  HENT:     Head: Atraumatic.     Mouth/Throat:     Mouth: Mucous membranes are moist.     Pharynx: Oropharynx is clear.  Eyes:     Extraocular Movements: Extraocular movements intact.     Pupils: Pupils are equal, round, and reactive to light.  Neck:     Musculoskeletal: Full passive range of motion without pain and normal range of motion. No muscular tenderness.  Cardiovascular:     Rate and Rhythm: Normal rate and regular rhythm.     Pulses: Normal pulses.  Pulmonary:     Effort: Pulmonary effort is normal.     Breath sounds: Normal breath sounds.  Chest:     Chest wall: No tenderness.  Musculoskeletal:        General: Tenderness and signs of injury present.     Right lower leg: No edema.     Left lower leg: No edema.     Comments: Focal tenderness to palpation of the right medial calf.  No obvious edema noted.  No erythema.  Negative Thompson test.  Right Achilles tendon appears intact.  Compartments are soft.  Focal ttp along the lower right scapular border.  No spinal tenderness.  Pt has FROM of the right shoulder.  No edema noted  Skin:    General: Skin is warm.     Capillary Refill: Capillary refill takes less than 2 seconds.     Findings: No erythema.  Neurological:     General: No focal deficit present.     Mental Status: She is alert.     Sensory: Sensation is intact. No sensory deficit.     Motor: Motor function is intact.     Coordination: Coordination is intact.     Gait: Gait is intact.     Comments: CN II-XII grossly itact      ED Treatments / Results  Labs (all labs ordered are listed, but only abnormal results are displayed) Labs Reviewed - No data to display  EKG None  Radiology Dg Shoulder Right  Result Date: 01/15/2019 CLINICAL DATA:  Pt tripped in a hole and fell onto Rt side. Pt c/o pain in Rt shoulder since fall. Pain increases with movement. EXAM: RIGHT SHOULDER - 2+  VIEW COMPARISON:  None. FINDINGS: There is no evidence of fracture or dislocation. There is no evidence of arthropathy or other focal bone abnormality. Soft tissues are unremarkable. IMPRESSION: Negative. Electronically Signed   By: Lajean Manes M.D.   On: 01/15/2019 12:07    Procedures Procedures (including critical care time)  Medications Ordered in ED Medications  ibuprofen (ADVIL) tablet 800 mg (800 mg Oral Given  01/15/19 1133)     Initial Impression / Assessment and Plan / ED Course  I have reviewed the triage vital signs and the nursing notes.  Pertinent labs & imaging results that were available during my care of the patient were reviewed by me and considered in my medical decision making (see chart for details).      Patient with posterior right shoulder and right lower leg pain secondary to a mechanical fall.  Neurovascular intact.  Compartments are soft.  No edema noted to the right lower leg.  No skin changes.  Focal tenderness to the medial aspect of the calf likely muscle tear.  She has a negative Thompson test.  She agrees to treatment plan with NSAID, muscle relaxer, RICE therapy and close orthopedic follow-up.  She has crutches at home.  Ace wrap applied to the calf for support as needed.    Final Clinical Impressions(s) / ED Diagnoses   Final diagnoses:  Fall, initial encounter  Muscle strain of right lower leg, initial encounter  Muscle strain of right scapular region, initial encounter    ED Discharge Orders    None       Pauline Ausriplett, Lucia Mccreadie, Cordelia Poche-C 01/15/19 1243    Bethann BerkshireZammit, Joseph, MD 01/15/19 1506

## 2019-01-15 NOTE — Discharge Instructions (Addendum)
Ice packs on and off to your right posterior shoulder.  Elevate and apply ice packs to your right lower leg.  Use your crutches as needed for weightbearing.  Wear the Ace wrap as needed for support, but do not wear continuously or at bedtime.  Follow-up with the orthopedic provider listed in 1 week if not improving.  Return to the ER for any worsening symptoms such as increasing pain, redness, or swelling of your leg.

## 2019-02-06 ENCOUNTER — Ambulatory Visit: Payer: Self-pay | Admitting: Nurse Practitioner

## 2019-03-30 ENCOUNTER — Other Ambulatory Visit: Payer: Self-pay

## 2019-03-31 ENCOUNTER — Encounter: Payer: Self-pay | Admitting: Family Medicine

## 2019-03-31 ENCOUNTER — Ambulatory Visit (INDEPENDENT_AMBULATORY_CARE_PROVIDER_SITE_OTHER): Payer: Self-pay | Admitting: Family Medicine

## 2019-03-31 VITALS — BP 138/79 | HR 80 | Temp 98.4°F | Resp 20 | Ht 67.0 in | Wt 197.0 lb

## 2019-03-31 DIAGNOSIS — N946 Dysmenorrhea, unspecified: Secondary | ICD-10-CM

## 2019-03-31 MED ORDER — NORETHINDRONE-ETH ESTRADIOL 0.5-35 MG-MCG PO TABS: 1.0000 | ORAL_TABLET | Freq: Every day | ORAL | 11 refills | Status: DC

## 2019-03-31 NOTE — Progress Notes (Signed)
Subjective:  Patient ID: Kathleen Perez, female    DOB: 15-Jan-1976  Age: 43 y.o. MRN: [id number]  CC: Contraception (no pap today )   HPI Kathleen Perez presents for need for contraception. She is having heavy periods. She has used birth control I the past to regulate them. The hheavy periods have resumed and she is starting one today. She would like to go bavk on [place]. It worked in the past. She is leaving soon on a beach trip. Pt. Plans a complete physical soon.      Depression screen [facility] 2/9 03/31/2019 11/01/2017  Decreased Interest 0 0  Down, Depressed, Hopeless 0 0  PHQ - 2 Score 0 0    History Lanell has a past medical history of Hypertension, Kidney stone, MRSA (methicillin resistant staph aureus) culture positive, Scoliosis, and Urinary tract infection.   She has a past surgical history that includes Kidney stone surgery and Cystectomy.   Her family history is not on file.She reports that she has been smoking cigarettes. She has been smoking about 1.00 pack per day. She has quit using smokeless tobacco. She reports that she does not drink alcohol or use drugs.    ROS Review of Systems  Constitutional: Negative.   HENT: Negative.   Eyes: Negative for visual disturbance.  Respiratory: Negative for shortness of breath.   Cardiovascular: Negative for chest pain.  Gastrointestinal: Negative for abdominal pain.  Genitourinary: Positive for menstrual problem, pelvic pain, vaginal bleeding and vaginal pain. Negative for difficulty urinating and vaginal discharge.  Musculoskeletal: Negative for arthralgias.    Objective:  BP 138/79   Pulse 80   Temp 98.4 F (36.9 C)   Resp 20   Ht 5\' 7"  (1.702 m)   Wt 197 lb (89.4 kg)   LMP 03/29/2019   SpO2 96%   BMI 30.85 kg/m   BP Readings from Last 3 Encounters:  03/31/19 138/79  01/15/19 (!) 145/84  06/09/18 113/72    Wt Readings from Last 3 Encounters:  03/31/19 197 lb (89.4 kg)  01/15/19 203 lb (92.1 kg)  06/09/18 203  lb (92.1 kg)     Physical Exam Constitutional:      General: She is not in acute distress.    Appearance: She is well-developed.  Cardiovascular:     Rate and Rhythm: Normal rate and regular rhythm.  Pulmonary:     Breath sounds: Normal breath sounds.  Skin:    General: Skin is warm and dry.  Neurological:     Mental Status: She is alert and oriented to person, place, and time.       Assessment & Plan:   Kathleen Perez was seen today for contraception.  Diagnoses and all orders for this visit:  Dysmenorrhea  Other orders -     norethindrone-ethinyl estradiol (NECON) 0.5-35 MG-MCG tablet; Take 1 tablet by mouth daily.    I have discontinued Kathleen Perez. Troy's acetaminophen, ondansetron, and doxycycline. I have also changed her norethindrone-ethinyl estradiol. Additionally, I am having her maintain her amLODipine, albuterol, hydrochlorothiazide, cyclobenzaprine, and ibuprofen.  Allergies as of 03/31/2019   No Known Allergies     Medication List       Accurate as of March 31, 2019 11:59 PM. If you have any questions, ask your nurse or doctor.        STOP taking these medications   acetaminophen 500 MG tablet Commonly known as: TYLENOL Stopped by: Claretta Fraise, MD   doxycycline 100 MG capsule Commonly known as:  VIBRAMYCIN Stopped by: Mechele Claude, MD   ondansetron 4 MG disintegrating tablet Commonly known as: Zofran ODT Stopped by: Mechele Claude, MD     TAKE these medications   albuterol 108 (90 Base) MCG/ACT inhaler Commonly known as: VENTOLIN HFA Inhale 1-2 puffs into the lungs every 6 (six) hours as needed for wheezing or shortness of breath.   amLODipine 5 MG tablet Commonly known as: NORVASC Take 1 tablet (5 mg total) by mouth daily.   cyclobenzaprine 10 MG tablet Commonly known as: FLEXERIL Take 1 tablet (10 mg total) by mouth 3 (three) times daily as needed.   hydrochlorothiazide 25 MG tablet Commonly known as: HYDRODIURIL Take 1 tablet (25 mg  total) by mouth daily.   ibuprofen 800 MG tablet Commonly known as: ADVIL Take 1 tablet (800 mg total) by mouth 3 (three) times daily.   norethindrone-ethinyl estradiol 0.5-35 MG-MCG tablet Commonly known as: NECON Take 1 tablet by mouth daily. Started by: Mechele Claude, MD        Follow-up: Return in about 3 months (around 07/01/2019).  Mechele Claude, M.D.

## 2019-04-01 ENCOUNTER — Encounter: Payer: Self-pay | Admitting: Family Medicine

## 2019-05-11 ENCOUNTER — Encounter: Payer: Self-pay | Admitting: Family Medicine

## 2019-05-25 ENCOUNTER — Encounter: Payer: Self-pay | Admitting: Family Medicine

## 2019-08-05 ENCOUNTER — Emergency Department (HOSPITAL_COMMUNITY)
Admission: EM | Admit: 2019-08-05 | Discharge: 2019-08-06 | Disposition: A | Discharge status: Home / Self Care | Payer: Managed Care, Other (non HMO) | Attending: Emergency Medicine | Admitting: Emergency Medicine

## 2019-08-05 DIAGNOSIS — Z79899 Other long term (current) drug therapy: Secondary | ICD-10-CM | POA: Diagnosis not present

## 2019-08-05 DIAGNOSIS — F17210 Nicotine dependence, cigarettes, uncomplicated: Secondary | ICD-10-CM | POA: Diagnosis not present

## 2019-08-05 DIAGNOSIS — R1013 Epigastric pain: Secondary | ICD-10-CM | POA: Diagnosis present

## 2019-08-05 DIAGNOSIS — I10 Essential (primary) hypertension: Secondary | ICD-10-CM | POA: Insufficient documentation

## 2019-08-06 ENCOUNTER — Other Ambulatory Visit: Payer: Self-pay

## 2019-08-06 ENCOUNTER — Encounter (HOSPITAL_COMMUNITY): Payer: Self-pay | Admitting: *Deleted

## 2019-08-06 LAB — CBC WITH DIFFERENTIAL/PLATELET
Abs Immature Granulocytes: 0.08 10*3/uL — ABNORMAL HIGH (ref 0.00–0.07)
Basophils Absolute: 0.1 10*3/uL (ref 0.0–0.1)
Basophils Relative: 1 %
Eosinophils Absolute: 0.7 10*3/uL — ABNORMAL HIGH (ref 0.0–0.5)
Eosinophils Relative: 5 %
HCT: 39.9 % (ref 36.0–46.0)
Hemoglobin: 12.5 g/dL (ref 12.0–15.0)
Immature Granulocytes: 1 %
Lymphocytes Relative: 22 %
Lymphs Abs: 3.3 10*3/uL (ref 0.7–4.0)
MCH: 28.9 pg (ref 26.0–34.0)
MCHC: 31.3 g/dL (ref 30.0–36.0)
MCV: 92.1 fL (ref 80.0–100.0)
Monocytes Absolute: 1.4 10*3/uL — ABNORMAL HIGH (ref 0.1–1.0)
Monocytes Relative: 9 %
Neutro Abs: 9.5 10*3/uL — ABNORMAL HIGH (ref 1.7–7.7)
Neutrophils Relative %: 62 %
Platelets: 354 10*3/uL (ref 150–400)
RBC: 4.33 MIL/uL (ref 3.87–5.11)
RDW: 13.5 % (ref 11.5–15.5)
WBC: 15 10*3/uL — ABNORMAL HIGH (ref 4.0–10.5)
nRBC: 0 % (ref 0.0–0.2)

## 2019-08-06 LAB — COMPREHENSIVE METABOLIC PANEL
ALT: 16 U/L (ref 0–44)
AST: 15 U/L (ref 15–41)
Albumin: 3.9 g/dL (ref 3.5–5.0)
Alkaline Phosphatase: 85 U/L (ref 38–126)
Anion gap: 6 (ref 5–15)
BUN: 20 mg/dL (ref 6–20)
CO2: 24 mmol/L (ref 22–32)
Calcium: 8.2 mg/dL — ABNORMAL LOW (ref 8.9–10.3)
Chloride: 104 mmol/L (ref 98–111)
Creatinine, Ser: 0.64 mg/dL (ref 0.44–1.00)
GFR calc Af Amer: >60 mL/min (ref >60)
GFR calc non Af Amer: >60 mL/min (ref >60)
Glucose, Bld: 103 mg/dL — ABNORMAL HIGH (ref 70–99)
Potassium: 3.5 mmol/L (ref 3.5–5.1)
Sodium: 134 mmol/L — ABNORMAL LOW (ref 135–145)
Total Bilirubin: 0.2 mg/dL — ABNORMAL LOW (ref 0.3–1.2)
Total Protein: 7.1 g/dL (ref 6.5–8.1)

## 2019-08-06 LAB — TROPONIN I (HIGH SENSITIVITY): Troponin I (High Sensitivity): <2 ng/L (ref <18)

## 2019-08-06 LAB — LIPASE, BLOOD: Lipase: 31 U/L (ref 11–51)

## 2019-08-06 MED ORDER — PANTOPRAZOLE SODIUM 40 MG PO TBEC
40.0000 mg | DELAYED_RELEASE_TABLET | Freq: Once | ORAL | Status: AC
Start: 2019-08-06 — End: 2019-08-06
  Administered 2019-08-06: 40 mg via ORAL
  Filled 2019-08-06: qty 1

## 2019-08-06 MED ORDER — ONDANSETRON 8 MG PO TBDP
8.0000 mg | ORAL_TABLET | Freq: Once | ORAL | Status: AC
  Administered 2019-08-06: 8 mg via ORAL
  Filled 2019-08-06: qty 1

## 2019-08-06 MED ORDER — LIDOCAINE VISCOUS HCL 2 % MT SOLN
15.0000 mL | Freq: Once | OROMUCOSAL | Status: AC
  Administered 2019-08-06: 15 mL via ORAL
  Filled 2019-08-06: qty 15

## 2019-08-06 MED ORDER — ALUM & MAG HYDROXIDE-SIMETH 200-200-20 MG/5ML PO SUSP
30.0000 mL | Freq: Once | ORAL | Status: AC
  Administered 2019-08-06: 01:00:00 30 mL via ORAL
  Filled 2019-08-06: qty 30

## 2019-08-06 NOTE — ED Provider Notes (Signed)
[facility] EMERGENCY DEPARTMENT Provider Note   CSN: [id number] Arrival date & time: 08/05/19  2353   History Chief Complaint  Patient presents with  . Abdominal Pain    Kathleen Perez is a 44 y.o. female.  The history is provided by the patient.  Abdominal Pain She has history of hypertension and comes in complaining of epigastric pain for the last 2 days.  Pain is relatively mild she rates it at 3/10.  There is no radiation of pain.  She did notice that it seemed to get worse about an hour after eating a meal at [employer].  There was mild associated nausea but no vomiting.  She denies dyspnea or diaphoresis.  She notices that it is tender if she presses on it.  Please note, patient is specifically pointing to her epigastric area rather than the left inframammary area mentioned in triage note.  She is a cigarette smoker.  She denies history of diabetes or hyperlipidemia.  There is a family history of premature coronary atherosclerosis (mother had initial heart attack at age 69).  She states that the only reason she came here is that her husband was concerned that pain could be related to her heart or her gallbladder.  Past Medical History:  Diagnosis Date  . Hypertension   . Kidney stone   . MRSA (methicillin resistant staph aureus) culture positive   . Scoliosis   . Urinary tract infection     There are no problems to display for this patient.   Past Surgical History:  Procedure Laterality Date  . CYSTECTOMY    . KIDNEY STONE SURGERY       OB History   No obstetric history on file.     History reviewed. No pertinent family history.  Social History   Tobacco Use  . Smoking status: Current Every Day Smoker    Packs/day: 1.00    Types: Cigarettes  . Smokeless tobacco: Former Engineer, water Use Topics  . Alcohol use: No  . Drug use: No    Home Medications Prior to Admission medications   Medication Sig Start Date End Date Taking? Authorizing Provider    albuterol (PROVENTIL HFA;VENTOLIN HFA) 108 (90 Base) MCG/ACT inhaler Inhale 1-2 puffs into the lungs every 6 (six) hours as needed for wheezing or shortness of breath. 10/29/17   Long, Arlyss Repress, MD  amLODipine (NORVASC) 5 MG tablet Take 1 tablet (5 mg total) by mouth daily. 09/03/17   Gilda Crease, MD  cyclobenzaprine (FLEXERIL) 10 MG tablet Take 1 tablet (10 mg total) by mouth 3 (three) times daily as needed. 01/15/19   Triplett, Tammy, PA-C  hydrochlorothiazide (HYDRODIURIL) 25 MG tablet Take 1 tablet (25 mg total) by mouth daily. 06/09/18   Linwood Dibbles, MD  ibuprofen (ADVIL) 800 MG tablet Take 1 tablet (800 mg total) by mouth 3 (three) times daily. 01/15/19   Triplett, Tammy, PA-C  norethindrone-ethinyl estradiol (NECON) 0.5-35 MG-MCG tablet Take 1 tablet by mouth daily. 03/31/19   Mechele Claude, MD    Allergies    Patient has no known allergies.  Review of Systems   Review of Systems  Gastrointestinal: Positive for abdominal pain.  All other systems reviewed and are negative.   Physical Exam Updated Vital Signs BP (!) 169/93 (BP Location: Right Arm)   Pulse 97   Temp 98.8 F (37.1 C) (Oral)   Resp 18   Ht 5\' 7"  (1.702 m)   Wt 83.9 kg   LMP 07/27/2019  SpO2 97%   BMI 28.98 kg/m   Physical Exam Vitals and nursing note reviewed.   44 year old female, resting comfortably and in no acute distress. Vital signs are significant for elevated blood pressure. Oxygen saturation is 97%, which is normal. Head is normocephalic and atraumatic. PERRLA, EOMI. Oropharynx is clear. Neck is nontender and supple without adenopathy or JVD. Back is nontender and there is no CVA tenderness. Lungs are clear without rales, wheezes, or rhonchi. Chest is nontender. Heart has regular rate and rhythm without murmur. Abdomen is soft, flat, with mild to moderate epigastric tenderness.  There is no rebound or guarding.  There is no right upper quadrant tenderness and no Murphy sign present.  There  are no masses or hepatosplenomegaly and peristalsis is normoactive. Extremities have no cyanosis or edema, full range of motion is present. Skin is warm and dry without rash. Neurologic: Mental status is normal, cranial nerves are intact, there are no motor or sensory deficits.  ED Results / Procedures / Treatments   Labs (all labs ordered are listed, but only abnormal results are displayed) Labs Reviewed  CBC WITH DIFFERENTIAL/PLATELET - Abnormal; Notable for the following components:      Result Value   WBC 15.0 (*)    Neutro Abs 9.5 (*)    Monocytes Absolute 1.4 (*)    Eosinophils Absolute 0.7 (*)    Abs Immature Granulocytes 0.08 (*)    All other components within normal limits  COMPREHENSIVE METABOLIC PANEL - Abnormal; Notable for the following components:   Sodium 134 (*)    Glucose, Bld 103 (*)    Calcium 8.2 (*)    Total Bilirubin 0.2 (*)    All other components within normal limits  LIPASE, BLOOD  TROPONIN I (HIGH SENSITIVITY)  TROPONIN I (HIGH SENSITIVITY)    EKG EKG Interpretation  Date/Time:  Thursday August 06 2019 00:30:54 EST Ventricular Rate:  84 PR Interval:    QRS Duration: 90 QT Interval:  358 QTC Calculation: 424 R Axis:   54 Text Interpretation: Sinus rhythm Probable inferior infarct, age indeterminate When compared with ECG of 10/31/2017, No significant change was found Confirmed by Delora Fuel ([id number]) on 08/06/2019 12:35:14 AM   Procedures Procedures   Medications Ordered in ED Medications  alum & mag hydroxide-simeth (MAALOX/MYLANTA) 200-200-20 MG/5ML suspension 30 mL (has no administration in time range)    And  lidocaine (XYLOCAINE) 2 % viscous mouth solution 15 mL (has no administration in time range)  pantoprazole (PROTONIX) EC tablet 40 mg (has no administration in time range)  ondansetron (ZOFRAN-ODT) disintegrating tablet 8 mg (has no administration in time range)    ED Course  I have reviewed the triage vital signs and the nursing  notes.  Pertinent lab results that were available during my care of the patient were reviewed by me and considered in my medical decision making (see chart for details).  MDM Rules/Calculators/A&P Epigastric pain which is most likely either GERD or gastritis or peptic ulcer.  Doubt cardiac pain, but will check ECG and troponin.  No physical signs to suggest biliary tract disease, but will check metabolic panel and lipase.  Old records are reviewed, and she has no relevant past visits.  Will give therapeutic trial of GI cocktail and pantoprazole.  ECG is unremarkable and unchanged from prior.  Troponin is normal.  Given constant symptoms for several days, repeat troponin is not necessary.  Heart score is 1 which puts her at low risk for major adverse  cardiac events in the next 6 weeks.  Other labs are unremarkable.  She had excellent relief of pain with above-noted treatment.  She is advised to use over-the-counter omeprazole on a daily basis.  Recommended that she monitor her blood pressure at home to make sure it is being appropriately controlled, encouraged to stop smoking.  Return precautions discussed.  Final Clinical Impression(s) / ED Diagnoses Final diagnoses:  Epigastric pain  Elevated blood pressure reading with diagnosis of hypertension    Rx / DC Orders ED Discharge Orders    None       Dione Booze, MD 08/06/19 [phone]

## 2019-08-06 NOTE — ED Triage Notes (Signed)
Pt c/o left side pain under her left breast; pt states it hurts when she presses on the spot

## 2019-08-06 NOTE — ED Notes (Signed)
ED Provider at bedside. 

## 2019-08-06 NOTE — Discharge Instructions (Addendum)
Take omeprazole (Prilosec OTC) once a day.  Take an antacid as needed.  Try to quit smoking - it can make acid reflux and ulcers worse.  Monitor your blood pressure at home and keep it under control.  Return if you are having any problems.

## 2019-11-30 ENCOUNTER — Encounter: Payer: Self-pay | Admitting: Family Medicine

## 2019-11-30 ENCOUNTER — Telehealth (INDEPENDENT_AMBULATORY_CARE_PROVIDER_SITE_OTHER): Payer: Self-pay | Admitting: Family Medicine

## 2019-11-30 DIAGNOSIS — G43009 Migraine without aura, not intractable, without status migrainosus: Secondary | ICD-10-CM

## 2019-11-30 MED ORDER — ONDANSETRON HCL 4 MG PO TABS: 4.0000 mg | ORAL_TABLET | Freq: Three times a day (TID) | ORAL | 1 refills | Status: DC | PRN

## 2019-11-30 MED ORDER — PROPRANOLOL HCL ER 80 MG PO CP24: 80.0000 mg | ORAL_CAPSULE | Freq: Every day | ORAL | 0 refills | Status: DC

## 2019-11-30 NOTE — Progress Notes (Signed)
Virtual Visit via telephone Note  I connected with Kathleen Perez on 11/30/19 at 1444 by telephone and verified that I am speaking with the correct person using two identifiers. Kathleen Perez is currently located at home and no other people are currently with her during visit. The provider, Elige Radon Marshall Kampf, MD is located in their office at time of visit.  Call ended at 1458  I discussed the limitations, risks, security and privacy concerns of performing an evaluation and management service by telephone and the availability of in person appointments. I also discussed with the patient that there may be a patient responsible charge related to this service. The patient expressed understanding and agreed to proceed.   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 She used to take migraine gabapentin and she gets nausea and vomiting and blurred vision.  She has been having them 2 times per week and they will last 3 hours-3 days. She has been taking tylenol and motrin together.  She denies aura.  She has them in the back of the neck and head. Pushing on pressure points helps. These are the same as her previous migraines.   No diagnosis found.  Outpatient Encounter Medications as of 11/30/2019  Medication Sig  . albuterol (PROVENTIL HFA;VENTOLIN HFA) 108 (90 Base) MCG/ACT inhaler Inhale 1-2 puffs into the lungs every 6 (six) hours as needed for wheezing or shortness of breath.  Marland Kitchen amLODipine (NORVASC) 5 MG tablet Take 1 tablet (5 mg total) by mouth daily.  . cyclobenzaprine (FLEXERIL) 10 MG tablet Take 1 tablet (10 mg total) by mouth 3 (three) times daily as needed.  . hydrochlorothiazide (HYDRODIURIL) 25 MG tablet Take 1 tablet (25 mg total) by mouth daily.  Marland Kitchen ibuprofen (ADVIL) 800 MG tablet Take 1 tablet (800 mg total) by mouth 3 (three) times daily.  . norethindrone-ethinyl estradiol (NECON) 0.5-35 MG-MCG tablet Take 1 tablet by mouth daily.   No  facility-administered encounter medications on file as of 11/30/2019.    Review of Systems  Constitutional: Negative for chills and fever.  Eyes: Negative for visual disturbance.  Respiratory: Negative for chest tightness and shortness of breath.   Cardiovascular: Negative for chest pain and leg swelling.  Musculoskeletal: Negative for back pain and gait problem.  Skin: Negative for rash.  Neurological: Negative for light-headedness and headaches.  Psychiatric/Behavioral: Negative for agitation and behavioral problems.  All other systems reviewed and are negative.   Observations/Objective: Patient sounds comfortable and in no acute distress  Assessment and Plan: Problem List Items Addressed This Visit    None    Visit Diagnoses    Atypical migraine    -  Primary   Relevant Medications   propranolol ER (INDERAL LA) 80 MG 24 hr capsule   ondansetron (ZOFRAN) 4 MG tablet      Patient had tried gabapentin in the distant past for prevention however she also says she tried the Imitrex.  She said the Imitrex did not do well but the gabapentin did okay, discussed with her blood pressure propranolol might be an option with less side effects and recommended 3 to 4-week follow-up with her PCP.  Sent Zofran to help with nausea as needed Follow up plan: Return if symptoms worsen or fail to improve.     I discussed the assessment and treatment plan with the patient. The patient was provided an opportunity to ask questions and all were answered. The patient agreed with the  plan and demonstrated an understanding of the instructions.   The patient was advised to call back or seek an in-person evaluation if the symptoms worsen or if the condition fails to improve as anticipated.  The above assessment and management plan was discussed with the patient. The patient verbalized understanding of and has agreed to the management plan. Patient is aware to call the clinic if symptoms persist or worsen.  Patient is aware when to return to the clinic for a follow-up visit. Patient educated on when it is appropriate to go to the emergency department.    I provided 14 minutes of non-face-to-face time during this encounter.    Worthy Rancher, MD

## 2019-12-13 ENCOUNTER — Encounter (HOSPITAL_COMMUNITY): Payer: Self-pay | Admitting: Emergency Medicine

## 2019-12-13 ENCOUNTER — Other Ambulatory Visit: Payer: Self-pay

## 2019-12-13 ENCOUNTER — Emergency Department (HOSPITAL_COMMUNITY)
Admission: EM | Admit: 2019-12-13 | Discharge: 2019-12-13 | Disposition: A | Discharge status: Home / Self Care | Payer: Managed Care, Other (non HMO) | Attending: Emergency Medicine | Admitting: Emergency Medicine

## 2019-12-13 DIAGNOSIS — I10 Essential (primary) hypertension: Secondary | ICD-10-CM | POA: Insufficient documentation

## 2019-12-13 DIAGNOSIS — F17210 Nicotine dependence, cigarettes, uncomplicated: Secondary | ICD-10-CM | POA: Insufficient documentation

## 2019-12-13 DIAGNOSIS — Z209 Contact with and (suspected) exposure to unspecified communicable disease: Secondary | ICD-10-CM

## 2019-12-13 DIAGNOSIS — Z2914 Encounter for prophylactic rabies immune globin: Secondary | ICD-10-CM | POA: Insufficient documentation

## 2019-12-13 DIAGNOSIS — Z23 Encounter for immunization: Secondary | ICD-10-CM | POA: Insufficient documentation

## 2019-12-13 DIAGNOSIS — Z203 Contact with and (suspected) exposure to rabies: Secondary | ICD-10-CM | POA: Insufficient documentation

## 2019-12-13 MED ORDER — RABIES VACCINE, PCEC IM SUSR
1.0000 mL | Freq: Once | INTRAMUSCULAR | Status: AC
  Administered 2019-12-13: 1 mL via INTRAMUSCULAR
  Filled 2019-12-13: qty 1

## 2019-12-13 NOTE — ED Triage Notes (Signed)
Pt states bats in her home. Unsure if she was bit by bat

## 2019-12-13 NOTE — Discharge Instructions (Addendum)
You have received your first dose of the rabies vaccine booster.  You will need the second booster vaccine on day three.  You can follow-up at the [facility] urgent care for your second vaccine

## 2019-12-13 NOTE — ED Notes (Signed)
Call from Officer Aaron Edelman [place] [employer] de[employer]   Report of bats and bat exposure to him

## 2019-12-13 NOTE — ED Notes (Addendum)
Here requesting rabies shots for the entire family   They are in one room   Reports "bats flying everywhere" including one in the home   Cannot report a bat bite for any of them as   "bats have an antiseptic in their bites and you do not feel them bite you. This happened to [place] before and we had to call the CDC.  We got a booster shot a couple of years ago"  Research scientist (life sciences) called

## 2019-12-13 NOTE — ED Provider Notes (Signed)
[facility] Provider Note   CSN: [id number] Arrival date & time: 12/13/19  1111     History Chief Complaint  Patient presents with  . Animal Bite    Kathleen Perez is a 44 y.o. female.  HPI      Kathleen Perez is a 44 y.o. female who presents to the Emergency Department requesting evaluation for possible exposure to rabies.  She states that the family noticed three bats inside their home last evening.  States the bat were flying around inside the home.  No known bite.  This is the third episode of exposure to bats in the home.  First exposure patient completed rabies vaccines and immunoglobulin protocol in 2016.  Had another exposure in 2018 in which they were given boosters.  She denies symptoms at this time.  Patient is here with other family members for evaluation.    Past Medical History:  Diagnosis Date  . Hypertension   . Kidney stone   . MRSA (methicillin resistant staph aureus) culture positive   . Scoliosis   . Urinary tract infection     There are no problems to display for this patient.   Past Surgical History:  Procedure Laterality Date  . CYSTECTOMY    . KIDNEY STONE SURGERY       OB History   No obstetric history on file.     No family history on file.  Social History   Tobacco Use  . Smoking status: Current Every Day Smoker    Packs/day: 1.00    Types: Cigarettes  . Smokeless tobacco: Former Network engineer  . Vaping Use: Never used  Substance Use Topics  . Alcohol use: No  . Drug use: No    Home Medications Prior to Admission medications   Medication Sig Start Date End Date Taking? Authorizing Provider  albuterol (PROVENTIL HFA;VENTOLIN HFA) 108 (90 Base) MCG/ACT inhaler Inhale 1-2 puffs into the lungs every 6 (six) hours as needed for wheezing or shortness of breath. 10/29/17   Long, Wonda Olds, MD  amLODipine (NORVASC) 5 MG tablet Take 1 tablet (5 mg total) by mouth daily. 09/03/17   Orpah Greek, MD    cyclobenzaprine (FLEXERIL) 10 MG tablet Take 1 tablet (10 mg total) by mouth 3 (three) times daily as needed. 01/15/19   Lonza Shimabukuro, PA-C  hydrochlorothiazide (HYDRODIURIL) 25 MG tablet Take 1 tablet (25 mg total) by mouth daily. 06/09/18   Dorie Rank, MD  ibuprofen (ADVIL) 800 MG tablet Take 1 tablet (800 mg total) by mouth 3 (three) times daily. 01/15/19   Sreya Froio, PA-C  norethindrone-ethinyl estradiol (NECON) 0.5-35 MG-MCG tablet Take 1 tablet by mouth daily. 03/31/19   Claretta Fraise, MD  ondansetron (ZOFRAN) 4 MG tablet Take 1 tablet (4 mg total) by mouth every 8 (eight) hours as needed for nausea or vomiting. 11/30/19   Dettinger, Fransisca Kaufmann, MD  propranolol ER (INDERAL LA) 80 MG 24 hr capsule Take 1 capsule (80 mg total) by mouth daily. 11/30/19   Dettinger, Fransisca Kaufmann, MD    Allergies    Patient has no known allergies.  Review of Systems   Review of Systems  Constitutional: Negative for chills, fatigue and fever.  Respiratory: Negative for shortness of breath.   Cardiovascular: Negative for chest pain.  Gastrointestinal: Negative for nausea and vomiting.  Genitourinary: Negative for dysuria, flank pain and hematuria.  Musculoskeletal: Negative for arthralgias and myalgias.  Skin: Negative for rash and wound.  Neurological: Negative for dizziness, weakness, numbness and headaches.  Hematological: Does not bruise/bleed easily.    Physical Exam Updated Vital Signs BP (!) 147/83 (BP Location: Right Arm)   Pulse 63   Temp 97.7 F (36.5 C) (Oral)   Resp 17   Ht 5\' 7"  (1.702 m)   Wt 83.1 kg   SpO2 97%   BMI 28.68 kg/m   Physical Exam Vitals and nursing note reviewed.  Constitutional:      Appearance: Normal appearance. She is not ill-appearing or toxic-appearing.  HENT:     Head: Normocephalic.  Neck:     Thyroid: No thyromegaly.     Meningeal: Kernig's sign absent.  Cardiovascular:     Rate and Rhythm: Normal rate and regular rhythm.     Pulses: Normal pulses.   Pulmonary:     Effort: Pulmonary effort is normal.     Breath sounds: No wheezing.  Musculoskeletal:        General: No tenderness. Normal range of motion.     Cervical back: Normal range of motion.  Skin:    General: Skin is warm.     Capillary Refill: Capillary refill takes less than 2 seconds.     Findings: No rash.  Neurological:     General: No focal deficit present.     Mental Status: She is alert.     Sensory: No sensory deficit.     Motor: No weakness.     ED Results / Procedures / Treatments   Labs (all labs ordered are listed, but only abnormal results are displayed) Labs Reviewed - No data to display  EKG None  Radiology No results found.  Procedures Procedures (including critical care time)  Medications Ordered in ED Medications  rabies vaccine (RABAVERT) injection 1 mL (has no administration in time range)    ED Course  I have reviewed the triage vital signs and the nursing notes.  Pertinent labs & imaging results that were available during my care of the patient were reviewed by me and considered in my medical decision making (see chart for details).    MDM Rules/Calculators/A&P                          Patient well-appearing.  No symptoms at this time no known bat bite.  Repeat exposure.  Completed initial rabies vaccine and immunoglobulin in 2016, had boosters in 2018.  Will repeat boosters today.  Patient agrees to day three vaccine booster to be given at the [facility] urgent care.   Final Clinical Impression(s) / ED Diagnoses Final diagnoses:  Need for rabies vaccination  Exposure to bat without known bite    Rx / DC Orders ED Discharge Orders    None       [facility], PA-C 12/13/19 1434    12/15/19, MD 12/13/19 1910

## 2019-12-16 ENCOUNTER — Ambulatory Visit
Admission: EM | Admit: 2019-12-16 | Discharge: 2019-12-16 | Disposition: A | Discharge status: Home / Self Care | Payer: Self-pay | Attending: Emergency Medicine | Admitting: Emergency Medicine

## 2019-12-16 DIAGNOSIS — Z203 Contact with and (suspected) exposure to rabies: Secondary | ICD-10-CM

## 2019-12-16 DIAGNOSIS — Z23 Encounter for immunization: Secondary | ICD-10-CM

## 2019-12-16 MED ORDER — RABIES VACCINE, PCEC IM SUSR
1.0000 mL | Freq: Once | INTRAMUSCULAR | Status: AC
  Administered 2019-12-16: 1 mL via INTRAMUSCULAR

## 2019-12-16 NOTE — ED Triage Notes (Signed)
Pt here for 2nc rabies booster, no concerns

## 2019-12-21 ENCOUNTER — Other Ambulatory Visit: Payer: Self-pay

## 2019-12-31 ENCOUNTER — Other Ambulatory Visit: Payer: Self-pay

## 2019-12-31 ENCOUNTER — Emergency Department (HOSPITAL_COMMUNITY)
Admission: EM | Admit: 2019-12-31 | Discharge: 2019-12-31 | Disposition: A | Discharge status: Home / Self Care | Payer: Self-pay | Attending: Emergency Medicine | Admitting: Emergency Medicine

## 2019-12-31 DIAGNOSIS — Z5321 Procedure and treatment not carried out due to patient leaving prior to being seen by health care provider: Secondary | ICD-10-CM | POA: Insufficient documentation

## 2019-12-31 DIAGNOSIS — T679XXA Effect of heat and light, unspecified, initial encounter: Secondary | ICD-10-CM | POA: Insufficient documentation

## 2020-01-15 ENCOUNTER — Other Ambulatory Visit: Payer: Self-pay | Admitting: Family Medicine

## 2020-01-15 DIAGNOSIS — G43009 Migraine without aura, not intractable, without status migrainosus: Secondary | ICD-10-CM

## 2020-05-17 ENCOUNTER — Other Ambulatory Visit: Payer: Self-pay | Admitting: Family Medicine

## 2020-09-20 ENCOUNTER — Other Ambulatory Visit: Payer: Self-pay

## 2020-09-20 ENCOUNTER — Ambulatory Visit (INDEPENDENT_AMBULATORY_CARE_PROVIDER_SITE_OTHER): Payer: 59 | Admitting: Nurse Practitioner

## 2020-09-20 ENCOUNTER — Encounter: Payer: Self-pay | Admitting: Nurse Practitioner

## 2020-09-20 ENCOUNTER — Ambulatory Visit: Payer: Self-pay | Admitting: Family Medicine

## 2020-09-20 VITALS — BP 152/82 | HR 78 | Temp 98.2°F | Ht 67.0 in | Wt 195.0 lb

## 2020-09-20 DIAGNOSIS — Z30019 Encounter for initial prescription of contraceptives, unspecified: Secondary | ICD-10-CM | POA: Insufficient documentation

## 2020-09-20 DIAGNOSIS — R030 Elevated blood-pressure reading, without diagnosis of hypertension: Secondary | ICD-10-CM | POA: Diagnosis not present

## 2020-09-20 DIAGNOSIS — I10 Essential (primary) hypertension: Secondary | ICD-10-CM | POA: Diagnosis not present

## 2020-09-20 LAB — PREGNANCY, URINE: Preg Test, Ur: NEGATIVE

## 2020-09-20 MED ORDER — HYDROCHLOROTHIAZIDE 25 MG PO TABS: 25.0000 mg | ORAL_TABLET | Freq: Every day | ORAL | 0 refills | Status: DC

## 2020-09-20 MED ORDER — AMLODIPINE BESYLATE 5 MG PO TABS: 5.0000 mg | ORAL_TABLET | Freq: Every day | ORAL | 0 refills | Status: DC

## 2020-09-20 MED ORDER — NORETHINDRONE-ETH ESTRADIOL 0.5-35 MG-MCG PO TABS: 1.0000 | ORAL_TABLET | Freq: Every day | ORAL | 6 refills | Status: DC

## 2020-09-20 NOTE — Progress Notes (Signed)
Acute Office Visit  Subjective:    Patient ID: Kathleen Perez, female    DOB: 12-Aug-1975, 45 y.o.   MRN: [id number]  Chief Complaint  Patient presents with  . Hypertension    HPI Patient presents for follow up of hypertension. Patient was diagnosed in few years ago.. The patient is not taking medication.  Patient reports after losing weight she discontinued all her blood pressure medicine.  Compliance with treatment has been poor.  Maintains a healthy low-sodium diet and regular exercise regimen , and following up as directed.   Past Medical History:  Diagnosis Date  . Hypertension   . Kidney stone   . MRSA (methicillin resistant staph aureus) culture positive   . Scoliosis   . Urinary tract infection     Past Surgical History:  Procedure Laterality Date  . CYSTECTOMY    . KIDNEY STONE SURGERY      History reviewed. No pertinent family history.  Social History   Socioeconomic History  . Marital status: Married    Spouse name: Not on file  . Number of children: Not on file  . Years of education: Not on file  . Highest education level: Not on file  Occupational History  . Not on file  Tobacco Use  . Smoking status: Current Every Day Smoker    Packs/day: 1.00    Types: Cigarettes  . Smokeless tobacco: Former Clinical biochemist  . Vaping Use: Never used  Substance and Sexual Activity  . Alcohol use: No  . Drug use: No  . Sexual activity: Yes    Birth control/protection: Condom  Other Topics Concern  . Not on file  Social History Narrative  . Not on file   Social Determinants of Health   Financial Resource Strain: Not on file  Food Insecurity: Not on file  Transportation Needs: Not on file  Physical Activity: Not on file  Stress: Not on file  Social Connections: Not on file  Intimate Partner Violence: Not on file    Outpatient Medications Prior to Visit  Medication Sig Dispense Refill  . amoxicillin (AMOXIL) 875 MG tablet Take by mouth.    Marland Kitchen  ibuprofen (ADVIL) 800 MG tablet Take 1 tablet (800 mg total) by mouth 3 (three) times daily. 21 tablet 0  . albuterol (PROVENTIL HFA;VENTOLIN HFA) 108 (90 Base) MCG/ACT inhaler Inhale 1-2 puffs into the lungs every 6 (six) hours as needed for wheezing or shortness of breath. 1 Inhaler 0  . amLODipine (NORVASC) 5 MG tablet Take 1 tablet (5 mg total) by mouth daily. 30 tablet 0  . cyclobenzaprine (FLEXERIL) 10 MG tablet Take 1 tablet (10 mg total) by mouth 3 (three) times daily as needed. 21 tablet 0  . hydrochlorothiazide (HYDRODIURIL) 25 MG tablet Take 1 tablet (25 mg total) by mouth daily. 30 tablet 0  . norethindrone-ethinyl estradiol (NECON) 0.5-35 MG-MCG tablet Take 1 tablet by mouth daily. 1 Package 11  . ondansetron (ZOFRAN) 4 MG tablet Take 1 tablet (4 mg total) by mouth every 8 (eight) hours as needed for nausea or vomiting. 30 tablet 1  . propranolol ER (INDERAL LA) 80 MG 24 hr capsule Take 1 capsule (80 mg total) by mouth daily. 90 capsule 0   No facility-administered medications prior to visit.    No Known Allergies  Review of Systems  Constitutional: Negative.   Eyes: Negative.   Respiratory: Negative.   Cardiovascular: Negative.   Genitourinary: Negative.   Musculoskeletal: Negative.   Skin:  Negative.   Neurological: Positive for headaches.  Psychiatric/Behavioral: Negative.   All other systems reviewed and are negative.      Objective:    Physical Exam Vitals reviewed.  Constitutional:      Appearance: Normal appearance.  HENT:     Head: Normocephalic.     Nose: Nose normal.  Eyes:     Conjunctiva/sclera: Conjunctivae normal.  Cardiovascular:     Pulses: Normal pulses.  Pulmonary:     Effort: Pulmonary effort is normal.     Breath sounds: Normal breath sounds.  Abdominal:     General: Bowel sounds are normal.  Musculoskeletal:        General: Normal range of motion.  Skin:    General: Skin is warm.  Neurological:     Mental Status: She is alert and  oriented to person, place, and time.     Comments: Headache  Psychiatric:        Behavior: Behavior normal.     BP (!) 152/82   Pulse 78   Temp 98.2 F (36.8 C) (Temporal)   Ht 5\' 7"  (1.702 m)   Wt 195 lb (88.5 kg)   SpO2 98%   BMI 30.54 kg/m  Wt Readings from Last 3 Encounters:  09/20/20 195 lb (88.5 kg)  12/13/19 183 lb 1.6 oz (83.1 kg)  08/06/19 185 lb (83.9 kg)    Health Maintenance Due  Topic Date Due  . Hepatitis C Screening  Never done  . COVID-19 Vaccine (1) 05/23/1989  . HIV Screening  Never done  . PAP SMEAR-Modifier  Never done    There are no preventive care reminders to display for this patient.   No results found for: TSH Lab Results  Component Value Date   WBC 15.0 (H) 08/06/2019   HGB 12.5 08/06/2019   HCT 39.9 08/06/2019   MCV 92.1 08/06/2019   PLT 354 08/06/2019   Lab Results  Component Value Date   NA 134 (L) 08/06/2019   K 3.5 08/06/2019   CO2 24 08/06/2019   GLUCOSE 103 (H) 08/06/2019   BUN 20 08/06/2019   CREATININE 0.64 08/06/2019   BILITOT 0.2 (L) 08/06/2019   ALKPHOS 85 08/06/2019   AST 15 08/06/2019   ALT 16 08/06/2019   PROT 7.1 08/06/2019   ALBUMIN 3.9 08/06/2019   CALCIUM 8.2 (L) 08/06/2019   ANIONGAP 6 08/06/2019      Assessment & Plan:   Problem List Items Addressed This Visit      Cardiovascular and Mediastinum   Primary hypertension - Primary    Hypertension not well controlled.  Patient reports losing weight and discontinued all blood pressure medication but recently has started experiencing headaches in increased blood pressure readings.  Today blood pressure is 152/82, repeat 153/94.  Advised patient to taking blood pressure medicine as prescribed, take blood pressure log daily and follow-up in 2 weeks. Patient verbalized understanding  Rx refill sent to pharmacy  Follow-up with worsening unresolved symptoms.      Relevant Medications   amLODipine (NORVASC) 5 MG tablet   hydrochlorothiazide (HYDRODIURIL)  25 MG tablet     Other   Elevated blood pressure reading   Encounter for female birth control   Relevant Medications   norethindrone-ethinyl estradiol (NECON) 0.5-35 MG-MCG tablet   Other Relevant Orders   Pregnancy, urine (Completed)       Meds ordered this encounter  Medications  . DISCONTD: norethindrone-ethinyl estradiol (NECON) 0.5-35 MG-MCG tablet    Sig: Take 1 tablet  by mouth daily.    Dispense:  28 tablet    Refill:  6    Order Specific Question:   Supervising Provider    Answer:   Raliegh Ip [[id number]]  . DISCONTD: amLODipine (NORVASC) 5 MG tablet    Sig: Take 1 tablet (5 mg total) by mouth daily.    Dispense:  90 tablet    Refill:  0    Order Specific Question:   Supervising Provider    Answer:   Raliegh Ip [[id number]]  . DISCONTD: hydrochlorothiazide (HYDRODIURIL) 25 MG tablet    Sig: Take 1 tablet (25 mg total) by mouth daily.    Dispense:  90 tablet    Refill:  0    Order Specific Question:   Supervising Provider    Answer:   Raliegh Ip [[id number]]  . amLODipine (NORVASC) 5 MG tablet    Sig: Take 1 tablet (5 mg total) by mouth daily.    Dispense:  90 tablet    Refill:  0    Order Specific Question:   Supervising Provider    Answer:   Raliegh Ip [[id number]]  . hydrochlorothiazide (HYDRODIURIL) 25 MG tablet    Sig: Take 1 tablet (25 mg total) by mouth daily.    Dispense:  90 tablet    Refill:  0    Order Specific Question:   Supervising Provider    Answer:   Raliegh Ip [[id number]]  . norethindrone-ethinyl estradiol (NECON) 0.5-35 MG-MCG tablet    Sig: Take 1 tablet by mouth daily.    Dispense:  28 tablet    Refill:  6    Order Specific Question:   Supervising Provider    Answer:   Raliegh Ip [[id number]]     Daryll Drown, NP

## 2020-09-20 NOTE — Patient Instructions (Signed)

## 2020-09-21 NOTE — Assessment & Plan Note (Signed)
Hypertension not well controlled.  Patient reports losing weight and discontinued all blood pressure medication but recently has started experiencing headaches in increased blood pressure readings.  Today blood pressure is 152/82, repeat 153/94.  Advised patient to taking blood pressure medicine as prescribed, take blood pressure log daily and follow-up in 2 weeks. Patient verbalized understanding  Rx refill sent to pharmacy  Follow-up with worsening unresolved symptoms.

## 2020-11-30 ENCOUNTER — Encounter (HOSPITAL_COMMUNITY): Payer: Self-pay

## 2020-11-30 ENCOUNTER — Emergency Department (HOSPITAL_COMMUNITY)
Admission: EM | Admit: 2020-11-30 | Discharge: 2020-11-30 | Disposition: A | Discharge status: Home / Self Care | Payer: 59 | Attending: Physician Assistant | Admitting: Physician Assistant

## 2020-11-30 ENCOUNTER — Other Ambulatory Visit: Payer: Self-pay

## 2020-11-30 DIAGNOSIS — Z5321 Procedure and treatment not carried out due to patient leaving prior to being seen by health care provider: Secondary | ICD-10-CM | POA: Insufficient documentation

## 2020-11-30 DIAGNOSIS — R109 Unspecified abdominal pain: Secondary | ICD-10-CM | POA: Diagnosis present

## 2020-11-30 LAB — CBC WITH DIFFERENTIAL/PLATELET
Abs Immature Granulocytes: 0.08 10*3/uL — ABNORMAL HIGH (ref 0.00–0.07)
Basophils Absolute: 0.2 10*3/uL — ABNORMAL HIGH (ref 0.0–0.1)
Basophils Relative: 1 %
Eosinophils Absolute: 0.8 10*3/uL — ABNORMAL HIGH (ref 0.0–0.5)
Eosinophils Relative: 6 %
HCT: 40 % (ref 36.0–46.0)
Hemoglobin: 13.2 g/dL (ref 12.0–15.0)
Immature Granulocytes: 1 %
Lymphocytes Relative: 23 %
Lymphs Abs: 3.2 10*3/uL (ref 0.7–4.0)
MCH: 29.9 pg (ref 26.0–34.0)
MCHC: 33 g/dL (ref 30.0–36.0)
MCV: 90.7 fL (ref 80.0–100.0)
Monocytes Absolute: 0.9 10*3/uL (ref 0.1–1.0)
Monocytes Relative: 6 %
Neutro Abs: 8.5 10*3/uL — ABNORMAL HIGH (ref 1.7–7.7)
Neutrophils Relative %: 63 %
Platelets: 384 10*3/uL (ref 150–400)
RBC: 4.41 MIL/uL (ref 3.87–5.11)
RDW: 13.3 % (ref 11.5–15.5)
WBC: 13.5 10*3/uL — ABNORMAL HIGH (ref 4.0–10.5)
nRBC: 0 % (ref 0.0–0.2)

## 2020-11-30 LAB — COMPREHENSIVE METABOLIC PANEL
ALT: 14 U/L (ref 0–44)
AST: 16 U/L (ref 15–41)
Albumin: 3.6 g/dL (ref 3.5–5.0)
Alkaline Phosphatase: 59 U/L (ref 38–126)
Anion gap: 11 (ref 5–15)
BUN: 17 mg/dL (ref 6–20)
CO2: 24 mmol/L (ref 22–32)
Calcium: 8.9 mg/dL (ref 8.9–10.3)
Chloride: 103 mmol/L (ref 98–111)
Creatinine, Ser: 0.67 mg/dL (ref 0.44–1.00)
GFR, Estimated: >60 mL/min (ref >60)
Glucose, Bld: 85 mg/dL (ref 70–99)
Potassium: 3.5 mmol/L (ref 3.5–5.1)
Sodium: 138 mmol/L (ref 135–145)
Total Bilirubin: 0.4 mg/dL (ref 0.3–1.2)
Total Protein: 7.2 g/dL (ref 6.5–8.1)

## 2020-11-30 LAB — CK: Total CK: 54 U/L (ref 38–234)

## 2020-11-30 LAB — MAGNESIUM: Magnesium: 2 mg/dL (ref 1.7–2.4)

## 2020-11-30 NOTE — ED Provider Notes (Signed)
Emergency Medicine Provider Triage Evaluation Note  Kathleen Perez , a 45 y.o. female  was evaluated in triage.  Pt complains of feeling poorly.  She has three pills left of Macrobid for a UTI. She feels like her UTI has resolved.  She started having her cycle and feels like her iron is low.  She denies any flank pain.  Reports legs feel cramping.  She feels dehydrated after fishing with family.  She denies fevers.   Review of Systems  Positive: Leg cramps, global weakness, fatigue Negative: Flank pain  Physical Exam  BP (!) 150/79 (BP Location: Right Arm)   Pulse 95   Temp 98.5 F (36.9 C) (Oral)   Resp 18   Ht 5\' 7"  (1.702 m)   Wt 92.1 kg   SpO2 92%   BMI 31.79 kg/m  Gen:   Awake, no distress   Resp:  Normal effort  MSK:   Moves extremities without difficulty  Other:  Pale appearing.   Medical Decision Making  Medically screening exam initiated at 2:49 PM.  Appropriate orders placed.  Kathleen Perez was informed that the remainder of the evaluation will be completed by another provider, this initial triage assessment does not replace that evaluation, and the importance of remaining in the ED until their evaluation is complete.     [name], PA-C 11/30/20 1452    01/30/21, MD 12/02/20 [phone]

## 2020-11-30 NOTE — ED Triage Notes (Signed)
Pt to er, pt states that she was dx with a uti last week tuesday, states that she went fishing with her family and felt like she was dehydrated, states that she drank a lot of water and started to feel better, states that she is about to start her menstrual cycle and feels sluggish, dry mouth and fatigue.  States that she has no hx of dm, states that she is currently on her menstrual cycle

## 2020-12-01 ENCOUNTER — Telehealth: Payer: Self-pay | Admitting: Family Medicine

## 2020-12-02 NOTE — Telephone Encounter (Signed)
I don't do those. Se if Dr. Algis Downs or Dr. Reece Agar is willing to.

## 2020-12-05 NOTE — Telephone Encounter (Signed)
Left message to call back  

## 2020-12-06 NOTE — Telephone Encounter (Signed)
Called patient, no answer 

## 2020-12-21 ENCOUNTER — Other Ambulatory Visit: Payer: Self-pay | Admitting: *Deleted

## 2020-12-21 DIAGNOSIS — I10 Essential (primary) hypertension: Secondary | ICD-10-CM

## 2020-12-21 MED ORDER — HYDROCHLOROTHIAZIDE 25 MG PO TABS: 25.0000 mg | ORAL_TABLET | Freq: Every day | ORAL | 0 refills | Status: DC

## 2020-12-21 MED ORDER — AMLODIPINE BESYLATE 5 MG PO TABS: 5.0000 mg | ORAL_TABLET | Freq: Every day | ORAL | 0 refills | Status: DC

## 2020-12-21 NOTE — Telephone Encounter (Signed)
Appointment scheduled.

## 2020-12-21 NOTE — Telephone Encounter (Signed)
Pt calling back about mirena. Please call back and advise

## 2021-01-09 ENCOUNTER — Encounter: Payer: Self-pay | Admitting: Family Medicine

## 2021-01-09 ENCOUNTER — Ambulatory Visit (INDEPENDENT_AMBULATORY_CARE_PROVIDER_SITE_OTHER): Payer: 59 | Admitting: Family Medicine

## 2021-01-09 ENCOUNTER — Other Ambulatory Visit: Payer: Self-pay

## 2021-01-09 DIAGNOSIS — I10 Essential (primary) hypertension: Secondary | ICD-10-CM

## 2021-01-09 DIAGNOSIS — Z30019 Encounter for initial prescription of contraceptives, unspecified: Secondary | ICD-10-CM | POA: Diagnosis not present

## 2021-01-09 MED ORDER — POTASSIUM CHLORIDE CRYS ER 20 MEQ PO TBCR
20.0000 meq | EXTENDED_RELEASE_TABLET | Freq: Every day | ORAL | 3 refills | Status: DC
Start: 2021-01-09 — End: 2021-05-08

## 2021-01-09 MED ORDER — HYDROCHLOROTHIAZIDE 25 MG PO TABS: 25.0000 mg | ORAL_TABLET | Freq: Every day | ORAL | 3 refills | Status: DC

## 2021-01-09 MED ORDER — AMLODIPINE BESYLATE 5 MG PO TABS: 5.0000 mg | ORAL_TABLET | Freq: Every day | ORAL | 3 refills | Status: DC

## 2021-01-09 MED ORDER — NORETHINDRONE-ETH ESTRADIOL 0.5-35 MG-MCG PO TABS: 1.0000 | ORAL_TABLET | Freq: Every day | ORAL | 6 refills | Status: DC

## 2021-01-09 NOTE — Progress Notes (Signed)
Subjective:  Patient ID: Kathleen Perez, female    DOB: 04/11/1976  Age: 45 y.o. MRN: [id number]  CC: Medical Management of Chronic Issues   HPI SHABREKA COULON presents for  follow-up of hypertension. Patient has no history of headache chest pain or shortness of breath or recent cough. Patient also denies symptoms of TIA such as focal numbness or weakness. Patient denies side effects from medication. States taking it regularly.  On menses. Feeling sluggish. Bloodwork in E.D. on 6/8 reflected no anemia, but borderline low potassium. She is planning to have a Mirena consult in 2 weeks. Planning to get a pap at that time.    History Madesyn has a past medical history of Hypertension, Kidney stone, MRSA (methicillin resistant staph aureus) culture positive, Scoliosis, and Urinary tract infection.   She has a past surgical history that includes Kidney stone surgery and Cystectomy.   Her family history is not on file.She reports that she has been smoking cigarettes. She has been smoking an average of 1 pack per day. She has never used smokeless tobacco. She reports that she does not drink alcohol and does not use drugs.  No current outpatient medications on file prior to visit.   No current facility-administered medications on file prior to visit.    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  HENT: Negative.    Eyes:  Negative for visual disturbance.  Respiratory:  Negative for shortness of breath.   Cardiovascular:  Negative for chest pain.  Gastrointestinal:  Negative for abdominal pain.  Musculoskeletal:  Negative for arthralgias.   Objective:  BP 116/71   Pulse 85   Temp 98.3 F (36.8 C)   Ht '5\' 7"'  (1.702 m)   Wt 197 lb 3.2 oz (89.4 kg)   SpO2 96%   BMI 30.89 kg/m   BP Readings from Last 3 Encounters:  01/09/21 116/71  11/30/20 (!) 148/83  09/20/20 (!) 152/82    Wt Readings from Last 3 Encounters:   01/09/21 197 lb 3.2 oz (89.4 kg)  11/30/20 203 lb (92.1 kg)  09/20/20 195 lb (88.5 kg)     Physical Exam Constitutional:      General: She is not in acute distress.    Appearance: She is well-developed.  Cardiovascular:     Rate and Rhythm: Normal rate and regular rhythm.  Pulmonary:     Breath sounds: Normal breath sounds.  Musculoskeletal:        General: Normal range of motion.  Skin:    General: Skin is warm and dry.  Neurological:     Mental Status: She is alert and oriented to person, place, and time.      Assessment & Plan:   Oriel was seen today for medical management of chronic issues.  Diagnoses and all orders for this visit:  Primary hypertension -     amLODipine (NORVASC) 5 MG tablet; Take 1 tablet (5 mg total) by mouth daily. -     hydrochlorothiazide (HYDRODIURIL) 25 MG tablet; Take 1 tablet (25 mg total) by mouth daily. -     BMP8+EGFR  Encounter for female birth control -     norethindrone-ethinyl estradiol (NECON) 0.5-35 MG-MCG tablet; Take 1 tablet by mouth daily.  Other orders -     potassium chloride SA (KLOR-CON) 20 MEQ tablet; Take 1 tablet (20 mEq total) by mouth daily. As a potassium supplement  Allergies as of 01/09/2021  No Known Allergies      Medication List        Accurate as of January 09, 2021  9:20 AM. If you have any questions, ask your nurse or doctor.          STOP taking these medications    ibuprofen 800 MG tablet Commonly known as: ADVIL Stopped by: Claretta Fraise, MD   nitrofurantoin (macrocrystal-monohydrate) 100 MG capsule Commonly known as: MACROBID Stopped by: Claretta Fraise, MD       TAKE these medications    amLODipine 5 MG tablet Commonly known as: NORVASC Take 1 tablet (5 mg total) by mouth daily.   hydrochlorothiazide 25 MG tablet Commonly known as: HYDRODIURIL Take 1 tablet (25 mg total) by mouth daily.   norethindrone-ethinyl estradiol 0.5-35 MG-MCG tablet Commonly known as: NECON Take 1  tablet by mouth daily.   potassium chloride SA 20 MEQ tablet Commonly known as: KLOR-CON Take 1 tablet (20 mEq total) by mouth daily. As a potassium supplement Started by: Claretta Fraise, MD        Meds ordered this encounter  Medications   amLODipine (NORVASC) 5 MG tablet    Sig: Take 1 tablet (5 mg total) by mouth daily.    Dispense:  90 tablet    Refill:  3   hydrochlorothiazide (HYDRODIURIL) 25 MG tablet    Sig: Take 1 tablet (25 mg total) by mouth daily.    Dispense:  90 tablet    Refill:  3   norethindrone-ethinyl estradiol (NECON) 0.5-35 MG-MCG tablet    Sig: Take 1 tablet by mouth daily.    Dispense:  28 tablet    Refill:  6   potassium chloride SA (KLOR-CON) 20 MEQ tablet    Sig: Take 1 tablet (20 mEq total) by mouth daily. As a potassium supplement    Dispense:  30 tablet    Refill:  3    Stay hydrated. Cool off frequently through the day. Take daily potassium supplement as ordered. Schedule mammogram and pap to be done soon.  Follow-up: Return in about 6 months (around 07/12/2021).  Claretta Fraise, M.D.

## 2021-01-17 IMAGING — DX RIGHT SHOULDER - 2+ VIEW
3 series · 3 of 3 positions shown · non-contrast
Comparison: None.

CLINICAL DATA: Pt tripped in a hole and fell onto Rt side. Pt c/o
pain in Rt shoulder since fall. Pain increases with movement.

EXAM:
RIGHT SHOULDER - 2+ VIEW

[shoulder grashey]
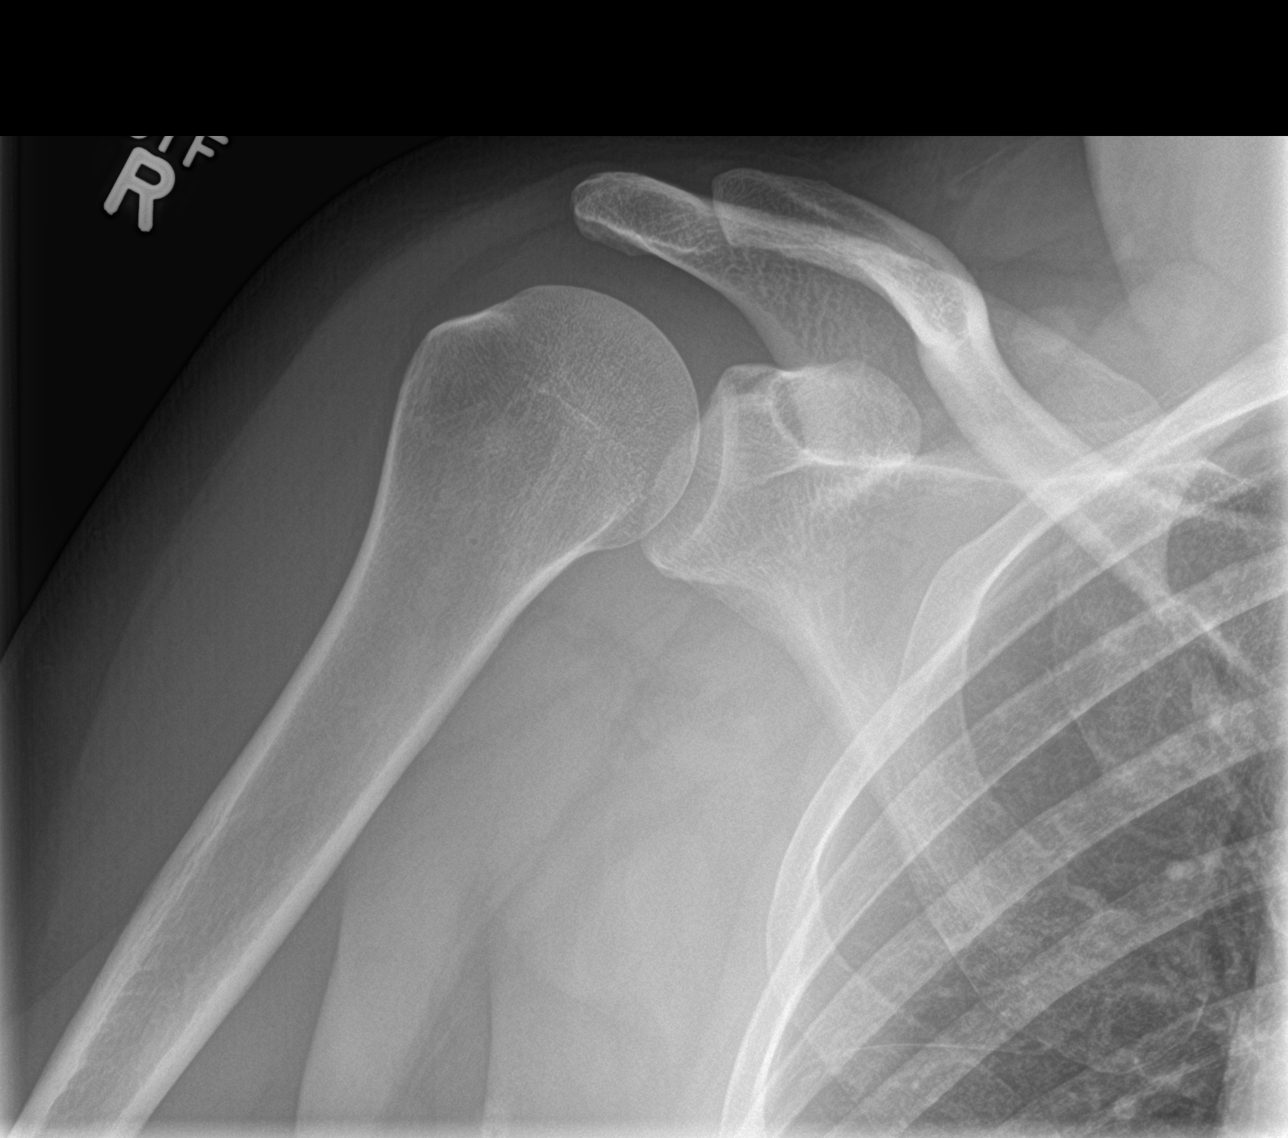

[shoulder y view]
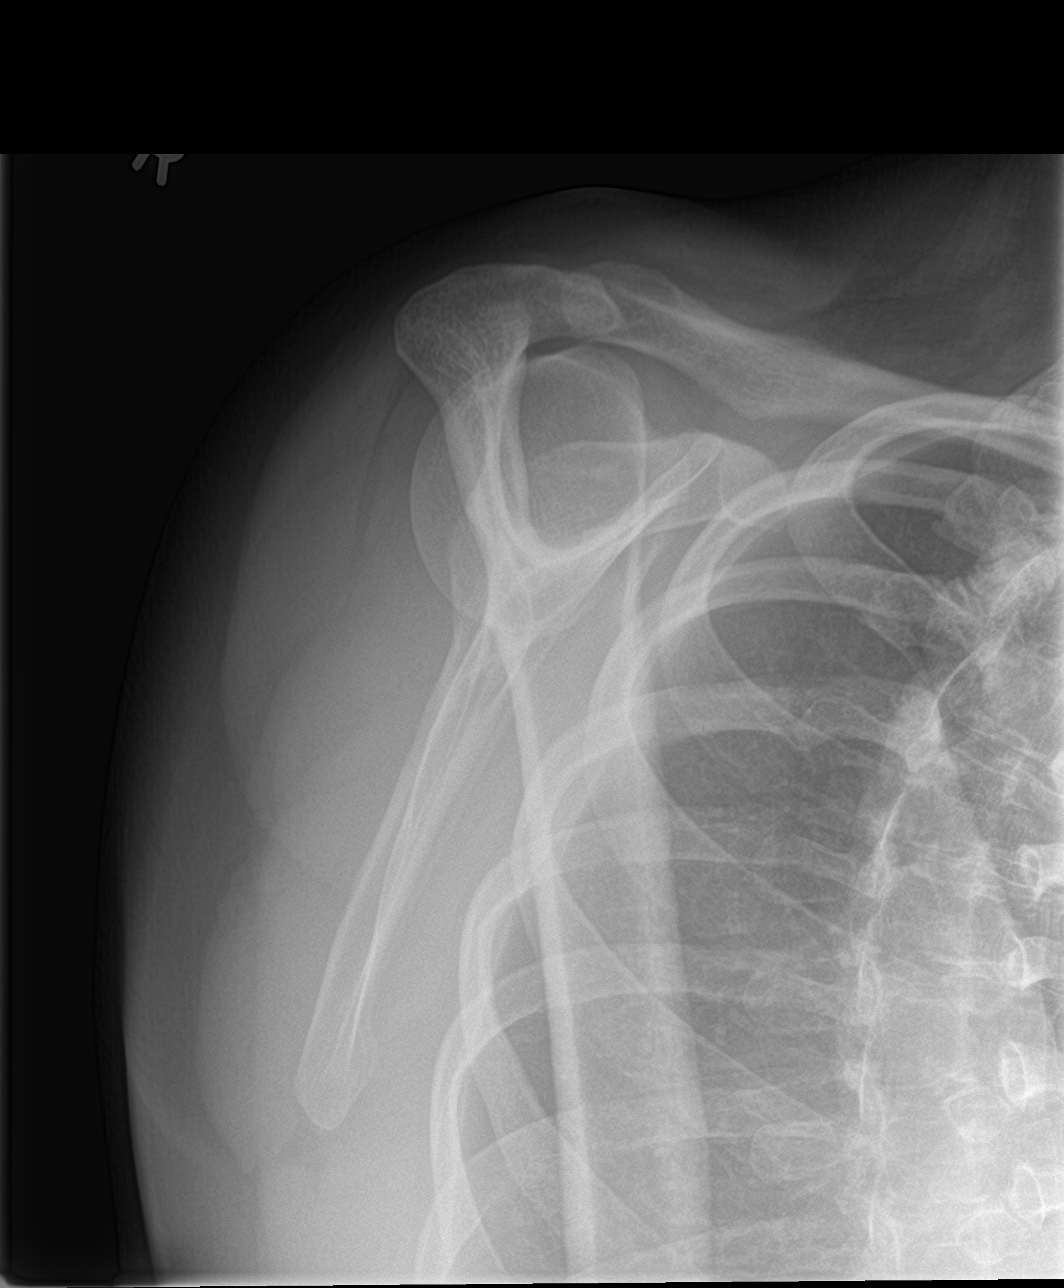

[shoulder axillary]
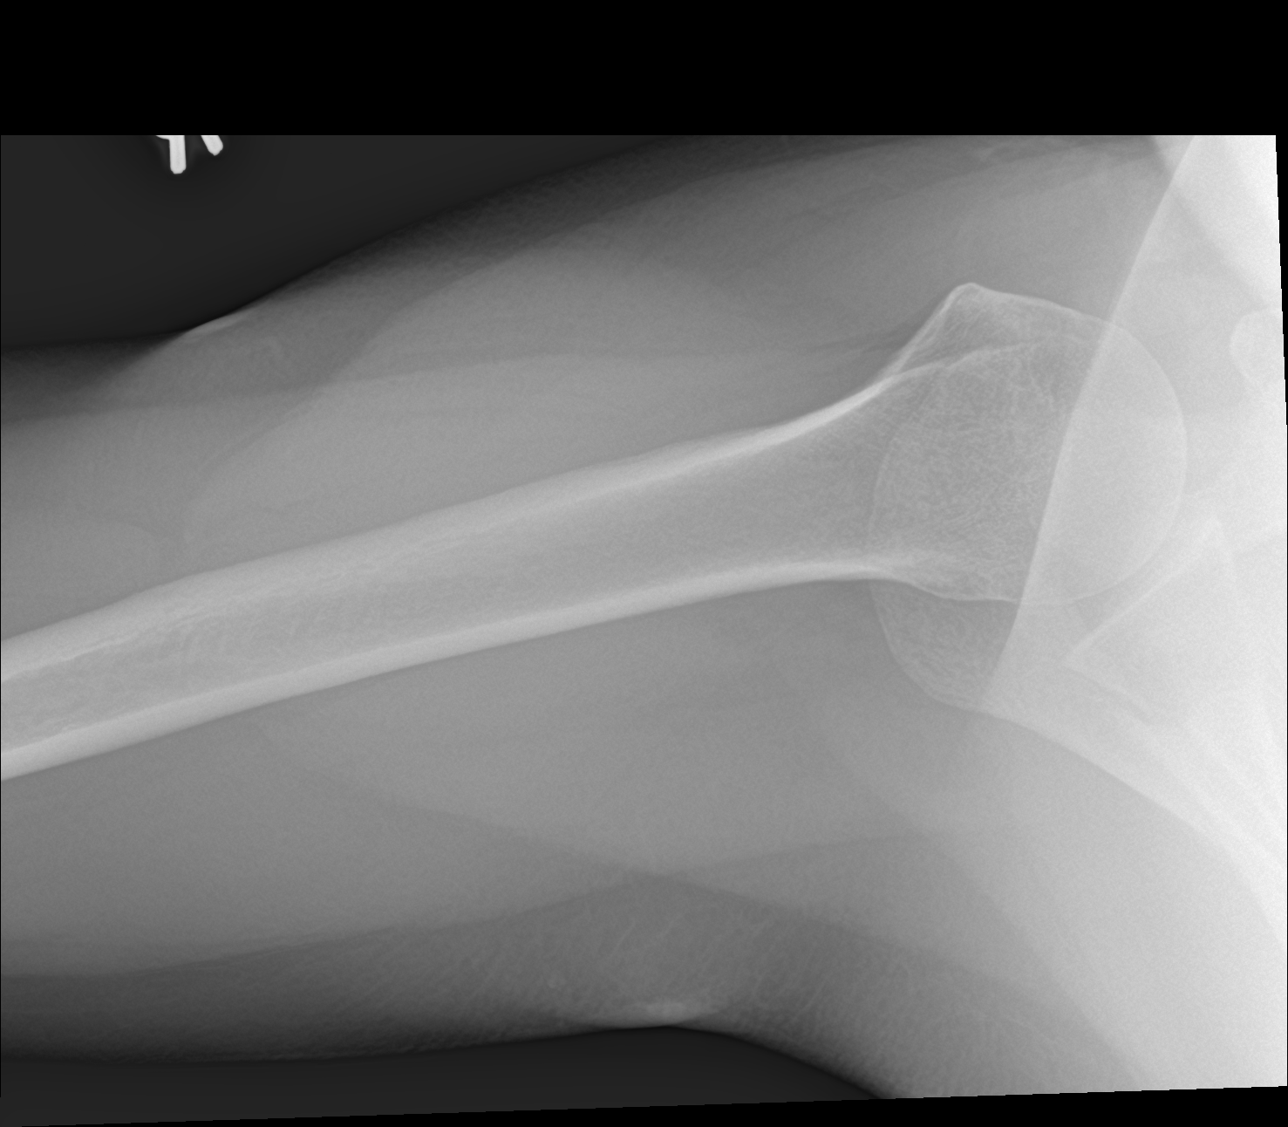

[3 of 3 positions shown; findings below may reference images not displayed]

FINDINGS: There is no evidence of fracture or dislocation. There is no
evidence of arthropathy or other focal bone abnormality. Soft
tissues are unremarkable.
IMPRESSION: Negative.

## 2021-01-25 ENCOUNTER — Ambulatory Visit: Payer: 59 | Admitting: Family Medicine

## 2021-03-07 ENCOUNTER — Other Ambulatory Visit: Payer: Self-pay | Admitting: Family Medicine

## 2021-03-07 ENCOUNTER — Encounter: Payer: Self-pay | Admitting: Family Medicine

## 2021-03-07 ENCOUNTER — Ambulatory Visit: Payer: 59 | Admitting: Family Medicine

## 2021-03-07 DIAGNOSIS — N3090 Cystitis, unspecified without hematuria: Secondary | ICD-10-CM | POA: Diagnosis not present

## 2021-03-07 DIAGNOSIS — N390 Urinary tract infection, site not specified: Secondary | ICD-10-CM

## 2021-03-07 LAB — MICROSCOPIC EXAMINATION: Renal Epithel, UA: NONE SEEN /hpf

## 2021-03-07 LAB — URINALYSIS, COMPLETE
Bilirubin, UA: NEGATIVE
Glucose, UA: NEGATIVE
Nitrite, UA: NEGATIVE
RBC, UA: NEGATIVE
Specific Gravity, UA: 1.025 (ref 1.005–1.030)
Urobilinogen, Ur: 0.2 mg/dL (ref 0.2–1.0)
pH, UA: 6 (ref 5.0–7.5)

## 2021-03-07 MED ORDER — FLUCONAZOLE 100 MG PO TABS
ORAL_TABLET | ORAL | 0 refills | Status: DC
Start: 2021-03-07 — End: 2021-08-28

## 2021-03-07 MED ORDER — CIPROFLOXACIN HCL 250 MG PO TABS: 250.0000 mg | ORAL_TABLET | Freq: Two times a day (BID) | ORAL | 0 refills | Status: DC

## 2021-03-07 NOTE — Progress Notes (Signed)
Subjective:    Patient ID: Kathleen Perez, female    DOB: June 16, 1976, 45 y.o.   MRN: [id number]   HPI: Kathleen Perez is a 45 y.o. female presenting for burning with urination and frequency for several days. Denies fever . No flank pain. No nausea, vomiting. Given sulfa at the beginning. Symptoms got better, but have come back. Has some itching at the groin area    Depression screen [facility] 2/9 01/09/2021 09/20/2020 03/31/2019 11/01/2017  Decreased Interest 0 0 0 0  Down, Depressed, Hopeless 0 0 0 0  PHQ - 2 Score 0 0 0 0  Altered sleeping - 0 - -  Tired, decreased energy - 0 - -  Change in appetite - 0 - -  Feeling bad or failure about yourself  - 0 - -  Trouble concentrating - 0 - -  Moving slowly or fidgety/restless - 0 - -  Suicidal thoughts - 0 - -  PHQ-9 Score - 0 - -     Relevant past medical, surgical, family and social history reviewed and updated as indicated.  Interim medical history since our last visit reviewed. Allergies and medications reviewed and updated.  ROS:  Review of Systems  Constitutional:  Negative for chills, diaphoresis and fever.  HENT:  Negative for congestion.   Eyes:  Negative for visual disturbance.  Respiratory:  Negative for cough and shortness of breath.   Cardiovascular:  Negative for chest pain and palpitations.  Gastrointestinal:  Negative for constipation, diarrhea and nausea.  Genitourinary:  Positive for dysuria, frequency and urgency. Negative for decreased urine volume, flank pain, hematuria, menstrual problem and pelvic pain.  Musculoskeletal:  Negative for arthralgias and joint swelling.  Skin:  Negative for rash.  Neurological:  Negative for dizziness and numbness.    Social History   Tobacco Use  Smoking Status Every Day   Packs/day: 1.00   Types: Cigarettes  Smokeless Tobacco Never       Objective:     Wt Readings from Last 3 Encounters:  01/09/21 197 lb 3.2 oz (89.4 kg)  11/30/20 203 lb (92.1 kg)  09/20/20 195 lb  (88.5 kg)     Exam deferred. Pt. Harboring due to COVID 19. Phone visit performed.   Assessment & Plan:   1. Cystitis     Meds ordered this encounter  Medications   ciprofloxacin (CIPRO) 250 MG tablet    Sig: Take 1 tablet (250 mg total) by mouth 2 (two) times daily.    Dispense:  10 tablet    Refill:  0   fluconazole (DIFLUCAN) 100 MG tablet    Sig: Take two with first dose. Then starting the next day take one daily until all are taken.    Dispense:  15 tablet    Refill:  0    No orders of the defined types were placed in this encounter.     Diagnoses and all orders for this visit:  Cystitis  Other orders -     ciprofloxacin (CIPRO) 250 MG tablet; Take 1 tablet (250 mg total) by mouth 2 (two) times daily. -     fluconazole (DIFLUCAN) 100 MG tablet; Take two with first dose. Then starting the next day take one daily until all are taken.   Virtual Visit via telephone Note  I discussed the limitations, risks, security and privacy concerns of performing an evaluation and management service by telephone and the availability of in person appointments. The patient was identified with two  identifiers. Pt.expressed understanding and agreed to proceed. Pt. Is at home. Dr. Darlyn Read is in his office.  Follow Up Instructions:   I discussed the assessment and treatment plan with the patient. The patient was provided an opportunity to ask questions and all were answered. The patient agreed with the plan and demonstrated an understanding of the instructions.   The patient was advised to call back or seek an in-person evaluation if the symptoms worsen or if the condition fails to improve as anticipated.   Total minutes including chart review and phone contact time: 7   Follow up plan: Return if symptoms worsen or fail to improve.  Mechele Claude, MD [place] [facility]

## 2021-03-11 LAB — URINE CULTURE

## 2021-05-02 ENCOUNTER — Encounter (HOSPITAL_COMMUNITY): Payer: Self-pay | Admitting: *Deleted

## 2021-05-02 ENCOUNTER — Other Ambulatory Visit: Payer: Self-pay

## 2021-05-02 ENCOUNTER — Emergency Department (HOSPITAL_COMMUNITY)
Admission: EM | Admit: 2021-05-02 | Discharge: 2021-05-02 | Disposition: A | Discharge status: Home / Self Care | Payer: 59 | Attending: Emergency Medicine | Admitting: Emergency Medicine

## 2021-05-02 DIAGNOSIS — D72829 Elevated white blood cell count, unspecified: Secondary | ICD-10-CM | POA: Insufficient documentation

## 2021-05-02 DIAGNOSIS — E876 Hypokalemia: Secondary | ICD-10-CM | POA: Insufficient documentation

## 2021-05-02 DIAGNOSIS — F17210 Nicotine dependence, cigarettes, uncomplicated: Secondary | ICD-10-CM | POA: Insufficient documentation

## 2021-05-02 DIAGNOSIS — N3090 Cystitis, unspecified without hematuria: Secondary | ICD-10-CM | POA: Diagnosis not present

## 2021-05-02 DIAGNOSIS — Z20822 Contact with and (suspected) exposure to covid-19: Secondary | ICD-10-CM | POA: Insufficient documentation

## 2021-05-02 DIAGNOSIS — J069 Acute upper respiratory infection, unspecified: Secondary | ICD-10-CM | POA: Diagnosis not present

## 2021-05-02 DIAGNOSIS — Z79899 Other long term (current) drug therapy: Secondary | ICD-10-CM | POA: Diagnosis not present

## 2021-05-02 DIAGNOSIS — I10 Essential (primary) hypertension: Secondary | ICD-10-CM | POA: Insufficient documentation

## 2021-05-02 DIAGNOSIS — R1110 Vomiting, unspecified: Secondary | ICD-10-CM | POA: Diagnosis present

## 2021-05-02 LAB — COMPREHENSIVE METABOLIC PANEL
ALT: 24 U/L (ref 0–44)
AST: 23 U/L (ref 15–41)
Albumin: 3.7 g/dL (ref 3.5–5.0)
Alkaline Phosphatase: 65 U/L (ref 38–126)
Anion gap: 7 (ref 5–15)
BUN: 17 mg/dL (ref 6–20)
CO2: 29 mmol/L (ref 22–32)
Calcium: 8.7 mg/dL — ABNORMAL LOW (ref 8.9–10.3)
Chloride: 99 mmol/L (ref 98–111)
Creatinine, Ser: 0.84 mg/dL (ref 0.44–1.00)
GFR, Estimated: >60 mL/min (ref >60)
Glucose, Bld: 93 mg/dL (ref 70–99)
Potassium: 3.2 mmol/L — ABNORMAL LOW (ref 3.5–5.1)
Sodium: 135 mmol/L (ref 135–145)
Total Bilirubin: 0.4 mg/dL (ref 0.3–1.2)
Total Protein: 6.8 g/dL (ref 6.5–8.1)

## 2021-05-02 LAB — URINALYSIS, ROUTINE W REFLEX MICROSCOPIC
Bilirubin Urine: NEGATIVE
Glucose, UA: NEGATIVE mg/dL
Hgb urine dipstick: NEGATIVE
Ketones, ur: NEGATIVE mg/dL
Nitrite: NEGATIVE
Protein, ur: NEGATIVE mg/dL
Specific Gravity, Urine: 1.023 (ref 1.005–1.030)
pH: 6 (ref 5.0–8.0)

## 2021-05-02 LAB — CBC WITH DIFFERENTIAL/PLATELET
Abs Immature Granulocytes: 0.09 10*3/uL — ABNORMAL HIGH (ref 0.00–0.07)
Basophils Absolute: 0.2 10*3/uL — ABNORMAL HIGH (ref 0.0–0.1)
Basophils Relative: 2 %
Eosinophils Absolute: 0.9 10*3/uL — ABNORMAL HIGH (ref 0.0–0.5)
Eosinophils Relative: 7 %
HCT: 42.7 % (ref 36.0–46.0)
Hemoglobin: 14 g/dL (ref 12.0–15.0)
Immature Granulocytes: 1 %
Lymphocytes Relative: 22 %
Lymphs Abs: 2.8 10*3/uL (ref 0.7–4.0)
MCH: 29.3 pg (ref 26.0–34.0)
MCHC: 32.8 g/dL (ref 30.0–36.0)
MCV: 89.3 fL (ref 80.0–100.0)
Monocytes Absolute: 1.1 10*3/uL — ABNORMAL HIGH (ref 0.1–1.0)
Monocytes Relative: 8 %
Neutro Abs: 8 10*3/uL — ABNORMAL HIGH (ref 1.7–7.7)
Neutrophils Relative %: 60 %
Platelets: 319 10*3/uL (ref 150–400)
RBC: 4.78 MIL/uL (ref 3.87–5.11)
RDW: 13.3 % (ref 11.5–15.5)
WBC: 13 10*3/uL — ABNORMAL HIGH (ref 4.0–10.5)
nRBC: 0 % (ref 0.0–0.2)

## 2021-05-02 LAB — RESP PANEL BY RT-PCR (FLU A&B, COVID) ARPGX2
Influenza A by PCR: NEGATIVE
Influenza B by PCR: NEGATIVE
SARS Coronavirus 2 by RT PCR: NEGATIVE

## 2021-05-02 LAB — I-STAT BETA HCG BLOOD, ED (MC, WL, AP ONLY): I-stat hCG, quantitative: <5 m[IU]/mL (ref <5)

## 2021-05-02 MED ORDER — ONDANSETRON 8 MG PO TBDP
8.0000 mg | ORAL_TABLET | Freq: Once | ORAL | Status: AC
  Administered 2021-05-02: 8 mg via ORAL
  Filled 2021-05-02: qty 1

## 2021-05-02 MED ORDER — CEPHALEXIN 500 MG PO CAPS: 500.0000 mg | ORAL_CAPSULE | Freq: Four times a day (QID) | ORAL | 0 refills | Status: DC

## 2021-05-02 MED ORDER — POTASSIUM CHLORIDE CRYS ER 20 MEQ PO TBCR
40.0000 meq | EXTENDED_RELEASE_TABLET | Freq: Once | ORAL | Status: AC
  Administered 2021-05-02: 40 meq via ORAL
  Filled 2021-05-02: qty 2

## 2021-05-02 NOTE — ED Provider Notes (Signed)
[facility] EMERGENCY DEPARTMENT Provider Note   CSN: [id number] Arrival date & time: 05/02/21  1255     History Chief Complaint  Patient presents with   Emesis    Kathleen Perez is a 45 y.o. female.   Emesis Associated symptoms: cough and sore throat   Associated symptoms: no abdominal pain, no arthralgias, no chills, no diarrhea, no fever, no headaches and no myalgias        Kathleen Perez is a 45 y.o. female who presents to the Emergency Department complaining of sore throat, cough, chest congestion symptoms have been present for 2 to 3 days.  Had 3 episodes of vomiting this morning, but has since tolerated solid food and liquids.  No abdominal pain or flank pain.  States that her children have recently been sick with similar symptoms.  She is concerned that she may have COVID or influenza.  Denies any chest pain, shortness of breath, no fever or chills.  No difficulty swallowing.  She also request a pregnancy test and believes that she may have a UTI.  She has some pressure with urination and frequency.  States that she is 2 weeks late on her menstrual cycle.  She took 1 home pregnancy test 1 week ago that was negative.  No history of ectopic pregnancies or miscarriages.  Denies vaginal discharge, and no concern for STI.  Currently taking oral birth control.  No missed doses.    Past Medical History:  Diagnosis Date   Hypertension    Kidney stone    MRSA (methicillin resistant staph aureus) culture positive    Scoliosis    Urinary tract infection     Patient Active Problem List   Diagnosis Date Noted   Elevated blood pressure reading 09/20/2020   Primary hypertension 09/20/2020   Encounter for female birth control 09/20/2020    Past Surgical History:  Procedure Laterality Date   CYSTECTOMY     KIDNEY STONE SURGERY       OB History   No obstetric history on file.     No family history on file.  Social History   Tobacco Use   Smoking status: Every Day     Packs/day: 1.50    Types: Cigarettes   Smokeless tobacco: Never  Vaping Use   Vaping Use: Never used  Substance Use Topics   Alcohol use: No   Drug use: No    Home Medications Prior to Admission medications   Medication Sig Start Date End Date Taking? Authorizing Provider  amLODipine (NORVASC) 5 MG tablet Take 1 tablet (5 mg total) by mouth daily. 01/09/21  Yes Stacks, Cletus Gash, MD  hydrochlorothiazide (HYDRODIURIL) 25 MG tablet Take 1 tablet (25 mg total) by mouth daily. 01/09/21  Yes Stacks, Cletus Gash, MD  potassium chloride SA (KLOR-CON) 20 MEQ tablet Take 1 tablet (20 mEq total) by mouth daily. As a potassium supplement 01/09/21  Yes Stacks, Cletus Gash, MD  ciprofloxacin (CIPRO) 250 MG tablet Take 1 tablet (250 mg total) by mouth 2 (two) times daily. Patient not taking: No sig reported 03/07/21   Claretta Fraise, MD  fluconazole (DIFLUCAN) 100 MG tablet Take two with first dose. Then starting the next day take one daily until all are taken. Patient not taking: No sig reported 03/07/21   Claretta Fraise, MD  norethindrone-ethinyl estradiol (NECON) 0.5-35 MG-MCG tablet Take 1 tablet by mouth daily. Patient not taking: No sig reported 01/09/21   Claretta Fraise, MD    Allergies    Patient has  no known allergies.  Review of Systems   Review of Systems  Constitutional:  Negative for chills, fatigue and fever.  HENT:  Positive for congestion and sore throat. Negative for trouble swallowing and voice change.   Respiratory:  Positive for cough. Negative for shortness of breath.   Cardiovascular:  Negative for chest pain.  Gastrointestinal:  Positive for vomiting. Negative for abdominal pain, blood in stool, diarrhea and nausea.  Genitourinary:  Positive for dysuria. Negative for decreased urine volume, flank pain, hematuria, vaginal bleeding and vaginal discharge.  Musculoskeletal:  Negative for arthralgias, back pain, myalgias, neck pain and neck stiffness.  Skin:  Negative for rash.  Neurological:   Negative for dizziness, weakness, numbness and headaches.  Hematological:  Does not bruise/bleed easily.   Physical Exam Updated Vital Signs BP 128/75 (BP Location: Left Arm)   Pulse 79   Temp 97.9 F (36.6 C) (Oral)   Resp 18   Ht 5\' 7"  (1.702 m)   Wt 90.3 kg   LMP 03/16/2021 (Exact Date)   SpO2 97%   BMI 31.17 kg/m   Physical Exam Vitals and nursing note reviewed.  Constitutional:      General: She is not in acute distress.    Appearance: Normal appearance. She is not ill-appearing or toxic-appearing.  HENT:     Mouth/Throat:     Mouth: Mucous membranes are moist.  Cardiovascular:     Rate and Rhythm: Normal rate and regular rhythm.     Pulses: Normal pulses.  Pulmonary:     Effort: Pulmonary effort is normal. No respiratory distress.     Breath sounds: Normal breath sounds. No wheezing.  Chest:     Chest wall: No tenderness.  Abdominal:     General: There is no distension.     Palpations: Abdomen is soft.     Tenderness: There is no abdominal tenderness. There is no right CVA tenderness or left CVA tenderness.  Musculoskeletal:        General: Normal range of motion.     Right lower leg: No edema.     Left lower leg: No edema.  Skin:    General: Skin is warm.     Capillary Refill: Capillary refill takes less than 2 seconds.     Findings: No rash.  Neurological:     General: No focal deficit present.     Mental Status: She is alert.     Sensory: No sensory deficit.     Motor: No weakness.    ED Results / Procedures / Treatments   Labs (all labs ordered are listed, but only abnormal results are displayed) Labs Reviewed  URINALYSIS, ROUTINE W REFLEX MICROSCOPIC - Abnormal; Notable for the following components:      Result Value   APPearance CLOUDY (*)    Leukocytes,Ua LARGE (*)    Bacteria, UA RARE (*)    All other components within normal limits  COMPREHENSIVE METABOLIC PANEL - Abnormal; Notable for the following components:   Potassium 3.2 (*)     Calcium 8.7 (*)    All other components within normal limits  CBC WITH DIFFERENTIAL/PLATELET - Abnormal; Notable for the following components:   WBC 13.0 (*)    Neutro Abs 8.0 (*)    Monocytes Absolute 1.1 (*)    Eosinophils Absolute 0.9 (*)    Basophils Absolute 0.2 (*)    Abs Immature Granulocytes 0.09 (*)    All other components within normal limits  RESP PANEL BY RT-PCR (FLU A&B,  COVID) ARPGX2  URINE CULTURE  I-STAT BETA HCG BLOOD, ED ([facility], [facility], [facility] ONLY)  GC/CHLAMYDIA PROBE AMP ([place]) NOT AT [facility]    EKG None  Radiology No results found.  Procedures Procedures   Medications Ordered in ED Medications  ondansetron (ZOFRAN-ODT) disintegrating tablet 8 mg (8 mg Oral Given 05/02/21 1404)    ED Course  I have reviewed the triage vital signs and the nursing notes.  Pertinent labs & imaging results that were available during my care of the patient were reviewed by me and considered in my medical decision making (see chart for details).    MDM Rules/Calculators/A&P                           Patient here requesting evaluation for possible COVID, influenza, and pregnancy.  Describes having flulike symptoms for several days with several family members having similar symptoms recently.  No fever, chest pain or shortness of breath.  She also reports having some dysuria symptoms in she is 2 weeks late on her menstrual cycle.  Is currently taking oral birth control.  Denies pelvic pain, vaginal discharge or possible STI.  Had 1 negative home pregnancy test 1 week ago.  On exam, patient is well-appearing nontoxic.  Vital signs are reassuring.  Abdomen is soft and nontender.  No peritoneal signs.  No CVA tenderness.  Will obtain urinalysis, labs and COVID influenza testing.  Labs interpreted by me, beta-hCG negative, COVID and influenza testing also negative.  Urinalysis shows cloudy urine with large leukocytes 11-20 WBCs and rare bacteria.  Urine culture pending.  Chemistries show  mild hypokalemia with potassium of 3.2.  Patient does take HCTZ for her hypertension.  Typically takes potassium supplementation but admits to recently running out of her medication.  Has refill available at pharmacy  I feel that patient is appropriate for discharge home, doubt emergent process and I feel that her symptoms are related to viral illness.  She may also be developing cystitis which I will treat with antibiotics.  Urine culture is pending.  Mild hypokalemia, oral potassium given here and patient agreeable to pick up her potassium prescription today before going home.  Understands that her potassium level will need to be rechecked in a couple of weeks.  Return precautions were discussed, all questions were answered.   Final Clinical Impression(s) / ED Diagnoses Final diagnoses:  Cystitis  Hypokalemia  Upper respiratory tract infection, unspecified type    Rx / DC Orders ED Discharge Orders     None        Pauline Aus, PA-C 05/02/21 1630    Pricilla Loveless, MD 05/04/21 [phone]

## 2021-05-02 NOTE — ED Notes (Signed)
ED Provider at bedside. 

## 2021-05-02 NOTE — Discharge Instructions (Signed)
Your pregnancy, COVID, and influenza testing today were negative.  You likely have a developing urinary tract infection.  You have been prescribed antibiotics to take for this.  Please take as directed until they are finished.  Also, your potassium level was slightly low.  This is likely related to your diuretic.  Please pick up your potassium prescription today and take as directed.  Your potassium level will need to be rechecked in 1 to 2 weeks.  Follow-up with your primary care provider for recheck.  Return to the emergency department for any new or worsening symptoms.

## 2021-05-02 NOTE — ED Triage Notes (Signed)
Pt c/o sore throat, cough, chest congestion x few days. Vomiting started this morning. Pt is 2 weeks late for her period, which has always been very regular. Pt took home pregnancy test 1 week ago which was negative.

## 2021-05-04 LAB — GC/CHLAMYDIA PROBE AMP (~~LOC~~) NOT AT ARMC
Chlamydia: NEGATIVE
Comment: NEGATIVE
Comment: NORMAL
Neisseria Gonorrhea: NEGATIVE

## 2021-05-04 LAB — URINE CULTURE: Culture: 60000 — AB

## 2021-05-05 ENCOUNTER — Telehealth: Payer: Self-pay | Admitting: *Deleted

## 2021-05-05 NOTE — Telephone Encounter (Signed)
Post ED Visit - Positive Culture Follow-up  Culture report reviewed by antimicrobial stewardship pharmacist: [facility] []  , Pharm.D. []  Enzo Bi, Pharm.D., BCPS AQ-ID []  , Pharm.D., BCPS []  Celedonio Miyamoto, .D., BCPS []  [place], .D., BCPS, AAHIVP []  Georgina Pillion, Pharm.D., BCPS, AAHIVP []  [address], PharmD, BCPS []  , PharmD, BCPS []  [place], PharmD, BCPS []  [address], PharmD []  , PharmD, BCPS []  Estella Husk, PharmD  Pharmacy Team []  Lysle Pearl, PharmD []  , PharmD []  Phillips Climes, PharmD []  , Rph []  Agapito Games) , PharmD []  Verlan Friends, PharmD []  , PharmD []  Mervyn Gay, PharmD []  , PharmD []  Vinnie Level, PharmD []  [facility], PharmD []  , PharmD []  Len Childs, PharmD   Positive urine culture Treated with Cephalexin, organism sensitive to the same and no further patient follow-up is required at this time. , PharmD  Greer Pickerel Talley 05/05/2021, 9:40 AM

## 2021-05-06 ENCOUNTER — Other Ambulatory Visit: Payer: Self-pay | Admitting: Family Medicine

## 2021-06-14 ENCOUNTER — Telehealth: Payer: Self-pay | Admitting: Family Medicine

## 2021-06-14 NOTE — Telephone Encounter (Signed)
Patient has cough, headache, and shortness of breath when she first woke up this morning.  Shortness of breath has improved since she was able to cough up something and get up and move around more.  She states she has headache, runny nose.  She did a video visit through [facility] a few weeks ago and was given Amoxicillin but never felt she completely got over it.  She has had two negative Covid tests.  Patient was schedule by front office for appointment at 7:55 am with Dr. Darlyn Read tomorrow.  Patient was advised to monitor symptoms and if any worsening shortness of breath or changes to call 911.

## 2021-06-15 ENCOUNTER — Other Ambulatory Visit: Payer: Self-pay

## 2021-06-15 ENCOUNTER — Ambulatory Visit: Payer: 59 | Admitting: Family Medicine

## 2021-06-15 ENCOUNTER — Encounter: Payer: Self-pay | Admitting: Family Medicine

## 2021-06-15 VITALS — BP 135/81 | HR 95 | Temp 98.4°F | Ht 67.0 in | Wt 205.4 lb

## 2021-06-15 DIAGNOSIS — J0100 Acute maxillary sinusitis, unspecified: Secondary | ICD-10-CM

## 2021-06-15 DIAGNOSIS — R051 Acute cough: Secondary | ICD-10-CM

## 2021-06-15 LAB — VERITOR FLU A/B WAIVED
Influenza A: NEGATIVE
Influenza B: NEGATIVE

## 2021-06-15 NOTE — Progress Notes (Signed)
Chief Complaint  Patient presents with   Cough   Sinus Pressure    HPI  Patient presents today for Patient presents with upper respiratory congestion. PRessure at cheeks. Rhinorrhea that is occasionally purulent. There is no sore throat. Patient reports coughing occasionally as well.  No sputum noted. There is no fever, chills or sweats. The patient reports wheezing and a little short of breath. Onset was 3-5 days ago. Got an inhaler of albuterol and feels better. Also was prescribed augmentin yesterday by online medical service.   PMH: Smoking status noted ROS: Per HPI  Objective: BP 135/81    Pulse 95    Temp 98.4 F (36.9 C)    Ht 5\' 7"  (1.702 m)    Wt 205 lb 6.4 oz (93.2 kg)    SpO2 96%    BMI 32.17 kg/m  Gen: NAD, alert, cooperative with exam HEENT: NCAT, Nasal passages swollen, red TMS clear CV: RRR, good S1/S2, no murmur Resp: Bronchitis changes, no wheezing Ext: No edema, warm Neuro: Alert and oriented, No gross deficits  Assessment and plan:  1. Acute maxillary sinusitis, recurrence not specified   2. Acute cough     Continue albuterol. Complete the entire course of Augmentin.   Orders Placed This Encounter  Procedures   Novel Coronavirus, NAA ([facility])    Order Specific Question:   Previously tested for COVID-19    Answer:   Yes    Order Specific Question:   Resident in a congregate (group) care setting    Answer:   No    Order Specific Question:   Is the patient student?    Answer:   No    Order Specific Question:   Employed in healthcare setting    Answer:   No    Order Specific Question:   Pregnant    Answer:   No    Order Specific Question:   Has patient completed COVID vaccination(s) (2 doses of Pfizer/Moderna 1 dose of Johnson & )    Answer:   Unknown   Veritor Flu A/B Waived    Order Specific Question:   Source    Answer:   nasopharangeal    Follow up as needed.  [employer], MD

## 2021-06-16 LAB — SARS-COV-2, NAA 2 DAY TAT

## 2021-06-16 LAB — NOVEL CORONAVIRUS, NAA: SARS-CoV-2, NAA: NOT DETECTED

## 2021-06-19 ENCOUNTER — Emergency Department (HOSPITAL_COMMUNITY): Admission: EM | Admit: 2021-06-19 | Discharge: 2021-06-19 | Discharge status: Against Medical Advice | Payer: 59

## 2021-06-19 NOTE — ED Notes (Addendum)
This RN called for pt x 3, no answer in lobby, left before being triaged

## 2021-06-20 ENCOUNTER — Encounter: Payer: Self-pay | Admitting: Family Medicine

## 2021-06-20 ENCOUNTER — Ambulatory Visit (INDEPENDENT_AMBULATORY_CARE_PROVIDER_SITE_OTHER): Payer: 59 | Admitting: Family Medicine

## 2021-06-20 DIAGNOSIS — U071 COVID-19: Secondary | ICD-10-CM | POA: Diagnosis not present

## 2021-06-20 MED ORDER — MOLNUPIRAVIR EUA 200MG CAPSULE: 4.0000 | ORAL_CAPSULE | Freq: Two times a day (BID) | ORAL | 0 refills | Status: AC

## 2021-06-20 NOTE — Progress Notes (Signed)
Subjective:    Patient ID: Kathleen Perez, female    DOB: Dec 10, 1975, 45 y.o.   MRN: [id number]   HPI: Kathleen Perez is a 45 y.o. female presenting for Covid positive test yesterday. Started feeling bad earlier in the day. Coughing, PND, congestion. Purulent rhinorrhea. Denies dyspnea. Fever to 101. Achy and chills. Really tired.    Depression screen [facility] 2/9 06/15/2021 01/09/2021 09/20/2020 03/31/2019 11/01/2017  Decreased Interest 0 0 0 0 0  Down, Depressed, Hopeless 0 0 0 0 0  PHQ - 2 Score 0 0 0 0 0  Altered sleeping 0 - 0 - -  Tired, decreased energy 2 - 0 - -  Change in appetite 0 - 0 - -  Feeling bad or failure about yourself  0 - 0 - -  Trouble concentrating 2 - 0 - -  Moving slowly or fidgety/restless 0 - 0 - -  Suicidal thoughts 0 - 0 - -  PHQ-9 Score 4 - 0 - -  Difficult doing work/chores Not difficult at all - - - -     Relevant past medical, surgical, family and social history reviewed and updated as indicated.  Interim medical history since our last visit reviewed. Allergies and medications reviewed and updated.  ROS:  Review of Systems  Constitutional:  Positive for activity change, appetite change, fatigue and fever. Negative for chills.  HENT:  Positive for congestion, postnasal drip, rhinorrhea and sinus pressure. Negative for ear discharge, ear pain, hearing loss, nosebleeds, sneezing and trouble swallowing.   Respiratory:  Positive for cough. Negative for chest tightness and shortness of breath.   Cardiovascular:  Negative for chest pain and palpitations.  Skin:  Negative for rash.    Social History   Tobacco Use  Smoking Status Every Day   Packs/day: 1.50   Types: Cigarettes  Smokeless Tobacco Never       Objective:     Wt Readings from Last 3 Encounters:  06/15/21 205 lb 6.4 oz (93.2 kg)  05/02/21 199 lb (90.3 kg)  01/09/21 197 lb 3.2 oz (89.4 kg)     Exam deferred. Pt. Harboring due to COVID 19. Phone visit performed.   Assessment &  Plan:   1. COVID-19 virus infection     Meds ordered this encounter  Medications   molnupiravir EUA (LAGEVRIO) 200 mg CAPS capsule    Sig: Take 4 capsules (800 mg total) by mouth 2 (two) times daily for 5 days.    Dispense:  40 capsule    Refill:  0    Orders Placed This Encounter  Procedures   Novel Coronavirus, NAA ([facility])    Order Specific Question:   Previously tested for COVID-19    Answer:   Yes    Order Specific Question:   Resident in a congregate (group) care setting    Answer:   No    Order Specific Question:   Is the patient student?    Answer:   No    Order Specific Question:   Employed in healthcare setting    Answer:   No    Order Specific Question:   Pregnant    Answer:   No    Order Specific Question:   Has patient completed COVID vaccination(s) (2 doses of Pfizer/Moderna 1 dose of Johnson [employer])    Answer:   Yes    Order Specific Question:   Has patient completed COVID Booster / 3rd dose    Answer:  Yes    Order Specific Question:   Release to patient    Answer:   Immediate      Diagnoses and all orders for this visit:  COVID-19 virus infection -     Novel Coronavirus, NAA ([facility])  Other orders -     molnupiravir EUA (LAGEVRIO) 200 mg CAPS capsule; Take 4 capsules (800 mg total) by mouth 2 (two) times daily for 5 days.    Virtual Visit via telephone Note  I discussed the limitations, risks, security and privacy concerns of performing an evaluation and management service by telephone and the availability of in person appointments. The patient was identified with two identifiers. Pt.expressed understanding and agreed to proceed. Pt. Is at home. Dr. Darlyn Read is in his office.  Follow Up Instructions:   I discussed the assessment and treatment plan with the patient. The patient was provided an opportunity to ask questions and all were answered. The patient agreed with the plan and demonstrated an understanding of the instructions.   The patient  was advised to call back or seek an in-person evaluation if the symptoms worsen or if the condition fails to improve as anticipated.   Total minutes including chart review and phone contact time: 13   Follow up plan: Return if symptoms worsen or fail to improve.  Mechele Claude, MD [place] [facility]

## 2021-06-21 ENCOUNTER — Ambulatory Visit: Payer: 59 | Admitting: Nurse Practitioner

## 2021-07-02 ENCOUNTER — Other Ambulatory Visit: Payer: Self-pay | Admitting: Family Medicine

## 2021-07-12 ENCOUNTER — Ambulatory Visit: Payer: 59 | Admitting: Family Medicine

## 2021-07-31 ENCOUNTER — Telehealth (INDEPENDENT_AMBULATORY_CARE_PROVIDER_SITE_OTHER): Payer: 59 | Admitting: Family Medicine

## 2021-07-31 DIAGNOSIS — Z91199 Patient's noncompliance with other medical treatment and regimen due to unspecified reason: Secondary | ICD-10-CM

## 2021-07-31 NOTE — Progress Notes (Signed)
Attempted to reach at: 4:52pm (no answer, no VM); 4:57pm (no answer, no VM); 5:11pm (third and final attempt made, no answer, no VM)  If she is still in need of a visit, please reschedule her with PCP.

## 2021-08-01 ENCOUNTER — Ambulatory Visit: Payer: 59 | Admitting: Family Medicine

## 2021-08-10 ENCOUNTER — Ambulatory Visit: Payer: 59 | Admitting: Family Medicine

## 2021-08-28 ENCOUNTER — Ambulatory Visit (INDEPENDENT_AMBULATORY_CARE_PROVIDER_SITE_OTHER): Payer: 59 | Admitting: Family Medicine

## 2021-08-28 ENCOUNTER — Encounter: Payer: Self-pay | Admitting: Family Medicine

## 2021-08-28 VITALS — BP 119/69 | HR 95 | Temp 98.3°F | Ht 67.0 in | Wt 205.4 lb

## 2021-08-28 DIAGNOSIS — I10 Essential (primary) hypertension: Secondary | ICD-10-CM

## 2021-08-28 DIAGNOSIS — Z30019 Encounter for initial prescription of contraceptives, unspecified: Secondary | ICD-10-CM

## 2021-08-28 DIAGNOSIS — Z13220 Encounter for screening for lipoid disorders: Secondary | ICD-10-CM

## 2021-08-28 MED ORDER — POTASSIUM CHLORIDE CRYS ER 20 MEQ PO TBCR: EXTENDED_RELEASE_TABLET | ORAL | 3 refills | Status: DC

## 2021-08-28 MED ORDER — MEDROXYPROGESTERONE ACETATE 150 MG/ML IM SUSP
150.0000 mg | INTRAMUSCULAR | 0 refills | Status: DC
Start: 2021-08-28 — End: 2021-12-08

## 2021-08-28 MED ORDER — HYDROCHLOROTHIAZIDE 25 MG PO TABS: 25.0000 mg | ORAL_TABLET | Freq: Every day | ORAL | 3 refills | Status: DC

## 2021-08-28 MED ORDER — AMLODIPINE BESYLATE 5 MG PO TABS: 5.0000 mg | ORAL_TABLET | Freq: Every day | ORAL | 3 refills | Status: DC

## 2021-08-28 NOTE — Progress Notes (Signed)
? ?Subjective:  ?Patient ID: Kathleen Perez, female    DOB: 04-12-1976  Age: 46 y.o. MRN: [id number] ? ?CC: Medical Management of Chronic Issues ? ? ?HPI ?Kathleen Perez presents for  presents for  follow-up of hypertension. Patient has no history of headache chest pain or shortness of breath or recent cough. Patient also denies symptoms of TIA such as focal numbness or weakness. Patient denies side effects from medication. States taking it regularly. ?Patient has used Depo-Provera for Kathleen Perez birth control in the past.  Kathleen Perez would like to resume that.  Kathleen Perez understands that Kathleen Perez will need to have a Pap smear soon. ? ?Depression screen [facility] 2/9 08/28/2021 06/15/2021 01/09/2021  ?Decreased Interest 0 0 0  ?Down, Depressed, Hopeless 0 0 0  ?PHQ - 2 Score 0 0 0  ?Altered sleeping - 0 -  ?Tired, decreased energy - 2 -  ?Change in appetite - 0 -  ?Feeling bad or failure about yourself  - 0 -  ?Trouble concentrating - 2 -  ?Moving slowly or fidgety/restless - 0 -  ?Suicidal thoughts - 0 -  ?PHQ-9 Score - 4 -  ?Difficult doing work/chores - Not difficult at all -  ? ? ?History ?Kathleen Perez has a past medical history of Hypertension, Kidney stone, MRSA (methicillin resistant staph aureus) culture positive, Scoliosis, and Urinary tract infection.  ? ?Kathleen Perez has a past surgical history that includes Kidney stone surgery and Cystectomy.  ? ?Kathleen Perez family history is not on file.Kathleen Perez reports that Kathleen Perez has been smoking cigarettes. Kathleen Perez has been smoking an average of 1.5 packs per day. Kathleen Perez has never used smokeless tobacco. Kathleen Perez reports that Kathleen Perez does not drink alcohol and does not use drugs. ? ? ? ?ROS ?Review of Systems  ?Constitutional: Negative.   ?HENT: Negative.    ?Eyes:  Negative for visual disturbance.  ?Respiratory:  Negative for shortness of breath.   ?Cardiovascular:  Negative for chest pain.  ?Gastrointestinal:  Negative for abdominal pain.  ?Musculoskeletal:  Negative for arthralgias.  ? ?Objective:  ?BP 119/69   Pulse 95   Temp 98.3 ?F (36.8  ?C)   Ht 5' 7" (1.702 m)   Wt 205 lb 6.4 oz (93.2 kg)   SpO2 93%   BMI 32.17 kg/m?  ? ?BP Readings from Last 3 Encounters:  ?08/28/21 119/69  ?06/15/21 135/81  ?05/02/21 128/75  ? ? ?Wt Readings from Last 3 Encounters:  ?08/28/21 205 lb 6.4 oz (93.2 kg)  ?06/15/21 205 lb 6.4 oz (93.2 kg)  ?05/02/21 199 lb (90.3 kg)  ? ? ? ?Physical Exam ?Constitutional:   ?   General: Kathleen Perez is not in acute distress. ?   Appearance: Kathleen Perez is well-developed.  ?Cardiovascular:  ?   Rate and Rhythm: Normal rate and regular rhythm.  ?Pulmonary:  ?   Breath sounds: Normal breath sounds.  ?Musculoskeletal:     ?   General: Normal range of motion.  ?Skin: ?   General: Skin is warm and dry.  ?Neurological:  ?   Mental Status: Kathleen Perez is alert and oriented to person, place, and time.  ? ? ? ? ?Assessment & Plan:  ? ?Cattie was seen today for medical management of chronic issues. ? ?Diagnoses and all orders for this visit: ? ?Primary hypertension ?-     CBC with Differential/Platelet ?-     CMP14+EGFR ?-     amLODipine (NORVASC) 5 MG tablet; Take 1 tablet (5 mg total) by mouth daily. ?-     hydrochlorothiazide (  HYDRODIURIL) 25 MG tablet; Take 1 tablet (25 mg total) by mouth daily. ? ?Lipid screening ?-     Lipid panel ? ?Encounter for female birth control ? ?Other orders ?-     potassium chloride SA (KLOR-CON M20) 20 MEQ tablet; TAKE 1  BY MOUTH ONCE DAILY FOR POTASSIUM ?-     medroxyPROGESTERone (DEPO-PROVERA) 150 MG/ML injection; Inject 1 mL (150 mg total) into the muscle every 3 (three) months. ? ? ? ? ? ? ?I have discontinued Kathleen Perez's fluconazole and cephALEXin. I have also changed Kathleen Perez Klor-Con M20 to potassium chloride SA. Additionally, I am having Kathleen Perez start on medroxyPROGESTERone. Lastly, I am having Kathleen Perez maintain Kathleen Perez norethindrone-ethinyl estradiol, amLODipine, and hydrochlorothiazide. ? ?Allergies as of 08/28/2021   ?No Known Allergies ?  ? ?  ?Medication List  ?  ? ?  ? Accurate as of August 28, 2021  9:06 PM. If you have any  questions, ask your nurse or doctor.  ?  ?  ? ?  ? ?STOP taking these medications   ? ?cephALEXin 500 MG capsule ?Commonly known as: KEFLEX ?Stopped by: Claretta Fraise, MD ?  ?fluconazole 100 MG tablet ?Commonly known as: Diflucan ?Stopped by: Claretta Fraise, MD ?  ? ?  ? ?TAKE these medications   ? ?amLODipine 5 MG tablet ?Commonly known as: NORVASC ?Take 1 tablet (5 mg total) by mouth daily. ?  ?hydrochlorothiazide 25 MG tablet ?Commonly known as: HYDRODIURIL ?Take 1 tablet (25 mg total) by mouth daily. ?  ?medroxyPROGESTERone 150 MG/ML injection ?Commonly known as: DEPO-PROVERA ?Inject 1 mL (150 mg total) into the muscle every 3 (three) months. ?Started by: Claretta Fraise, MD ?  ?norethindrone-ethinyl estradiol 0.5-35 MG-MCG tablet ?Commonly known as: NECON ?Take 1 tablet by mouth daily. ?  ?potassium chloride SA 20 MEQ tablet ?Commonly known as: Klor-Con M20 ?TAKE 1  BY MOUTH ONCE DAILY FOR POTASSIUM ?  ? ?  ? ? ? ?Follow-up: No follow-ups on file. ? ?Claretta Fraise, M.D. ?

## 2021-08-29 LAB — LIPID PANEL: Chol/HDL Ratio: 9.7 ratio — ABNORMAL HIGH (ref 0.0–4.4)

## 2021-08-30 ENCOUNTER — Other Ambulatory Visit: Payer: Self-pay | Admitting: Family Medicine

## 2021-08-30 DIAGNOSIS — D72829 Elevated white blood cell count, unspecified: Secondary | ICD-10-CM

## 2021-09-12 LAB — CBC WITH DIFFERENTIAL/PLATELET
Basophils Absolute: 0.2 10*3/uL (ref 0.0–0.2)
Basos: 1 %
EOS (ABSOLUTE): 0.8 10*3/uL — ABNORMAL HIGH (ref 0.0–0.4)
Eos: 5 %
Hematocrit: 41.8 % (ref 34.0–46.6)
Hemoglobin: 14.7 g/dL (ref 11.1–15.9)
Immature Grans (Abs): 0.1 10*3/uL (ref 0.0–0.1)
Immature Granulocytes: 1 %
Lymphocytes Absolute: 3.8 10*3/uL — ABNORMAL HIGH (ref 0.7–3.1)
Lymphs: 27 %
MCH: 29.6 pg (ref 26.6–33.0)
MCHC: 35.2 g/dL (ref 31.5–35.7)
MCV: 84 fL (ref 79–97)
Monocytes Absolute: 1.1 10*3/uL — ABNORMAL HIGH (ref 0.1–0.9)
Monocytes: 8 %
Neutrophils Absolute: 8.1 10*3/uL — ABNORMAL HIGH (ref 1.4–7.0)
Neutrophils: 58 %
Platelets: 375 10*3/uL (ref 150–450)
RBC: 4.97 x10E6/uL (ref 3.77–5.28)
RDW: 12.9 % (ref 11.7–15.4)
WBC: 14 10*3/uL — ABNORMAL HIGH (ref 3.4–10.8)

## 2021-09-12 LAB — CMP14+EGFR
ALT: 25 IU/L (ref 0–32)
AST: 24 IU/L (ref 0–40)
Albumin/Globulin Ratio: 1.4 (ref 1.2–2.2)
Albumin: 3.8 g/dL (ref 3.8–4.8)
Alkaline Phosphatase: 84 IU/L (ref 44–121)
BUN/Creatinine Ratio: 27 — ABNORMAL HIGH (ref 9–23)
BUN: 16 mg/dL (ref 6–24)
Bilirubin Total: <0.2 mg/dL (ref 0.0–1.2)
CO2: 22 mmol/L (ref 20–29)
Calcium: 10 mg/dL (ref 8.7–10.2)
Chloride: 99 mmol/L (ref 96–106)
Creatinine, Ser: 0.6 mg/dL (ref 0.57–1.00)
Globulin, Total: 2.7 g/dL (ref 1.5–4.5)
Glucose: 107 mg/dL — ABNORMAL HIGH (ref 70–99)
Potassium: 3.6 mmol/L (ref 3.5–5.2)
Sodium: 141 mmol/L (ref 134–144)
Total Protein: 6.5 g/dL (ref 6.0–8.5)
eGFR: 113 mL/min/{1.73_m2} (ref >59)

## 2021-09-12 LAB — LIPID PANEL
Chol/HDL Ratio: 9.7 ratio — ABNORMAL HIGH (ref 0.0–4.4)
Cholesterol, Total: 193 mg/dL (ref 100–199)
HDL: 20 mg/dL — ABNORMAL LOW (ref >39)

## 2021-09-14 ENCOUNTER — Ambulatory Visit: Payer: 59 | Admitting: Family Medicine

## 2021-09-14 ENCOUNTER — Encounter: Payer: Self-pay | Admitting: Family Medicine

## 2021-09-14 VITALS — BP 121/70 | HR 82 | Temp 97.9°F | Ht 67.0 in | Wt 200.0 lb

## 2021-09-14 DIAGNOSIS — N898 Other specified noninflammatory disorders of vagina: Secondary | ICD-10-CM

## 2021-09-14 DIAGNOSIS — A5901 Trichomonal vulvovaginitis: Secondary | ICD-10-CM

## 2021-09-14 DIAGNOSIS — K529 Noninfective gastroenteritis and colitis, unspecified: Secondary | ICD-10-CM

## 2021-09-14 LAB — WET PREP FOR TRICH, YEAST, CLUE
Clue Cell Exam: NEGATIVE
Trichomonas Exam: POSITIVE — AB
Yeast Exam: NEGATIVE

## 2021-09-14 MED ORDER — FLUCONAZOLE 150 MG PO TABS: 150.0000 mg | ORAL_TABLET | Freq: Once | ORAL | 0 refills | Status: DC

## 2021-09-14 MED ORDER — METRONIDAZOLE 500 MG PO TABS: 500.0000 mg | ORAL_TABLET | Freq: Two times a day (BID) | ORAL | 0 refills | Status: AC

## 2021-09-14 MED ORDER — ONDANSETRON 4 MG PO TBDP: 4.0000 mg | ORAL_TABLET | Freq: Three times a day (TID) | ORAL | 0 refills | Status: DC | PRN

## 2021-09-14 NOTE — Progress Notes (Signed)
? ?Assessment & Plan:  ?1. Gastroenteritis ?Education provided on food choices to help relieve diarrhea.  Encouraged adequate hydration.  Note provided for work today. ?- ondansetron (ZOFRAN-ODT) 4 MG disintegrating tablet; Take 1 tablet (4 mg total) by mouth every 8 (eight) hours as needed for nausea or vomiting.  Dispense: 30 tablet; Refill: 0 ? ?2. Trichomoniasis of vagina ?Education provided on trichomoniasis.  Advised patient to discuss with her husband and have him tested and treated as well. ?- metroNIDAZOLE (FLAGYL) 500 MG tablet; Take 1 tablet (500 mg total) by mouth 2 (two) times daily for 7 days.  Dispense: 14 tablet; Refill: 0 ? ?3. Vaginal itching ?Patient declined blood work for sexually transmitted infections. ?- WET PREP FOR TRICH, YEAST, CLUE ?- NuSwab Vaginitis Plus (VG+) ? ? ?Follow up plan: Return if symptoms worsen or fail to improve. ? ?Deliah Boston, MSN, APRN, FNP-C ?[facility] ? ?Subjective:  ? ?Patient ID: DAMESHA Perez, female    DOB: Oct 16, 1975, 46 y.o.   MRN: [id number] ? ?HPI: ?Kathleen Perez is a 46 y.o. female presenting on 09/14/2021 for Nausea, Diarrhea, and sufla burps  (X 1 day) ? ?Patient reports she believes the [place] she ate last night for dinner was bad. She started having nausea with dinner, had chills throughout the night, and has been having diarrhea all day today.  ? ?She also reports she thinks she has a yeast infection that started three weeks ago. She has been having vaginal itching and thick white vaginal discharge. She has gone through three boxes of Monistat without relief.  ? ? ?ROS: Negative unless specifically indicated above in HPI.  ? ?Relevant past medical history reviewed and updated as indicated.  ? ?Allergies and medications reviewed and updated. ? ? ?Current Outpatient Medications:  ?  amLODipine (NORVASC) 5 MG tablet, Take 1 tablet (5 mg total) by mouth daily., Disp: 90 tablet, Rfl: 3 ?  hydrochlorothiazide (HYDRODIURIL) 25 MG  tablet, Take 1 tablet (25 mg total) by mouth daily., Disp: 90 tablet, Rfl: 3 ?  medroxyPROGESTERone (DEPO-PROVERA) 150 MG/ML injection, Inject 1 mL (150 mg total) into the muscle every 3 (three) months., Disp: 1 mL, Rfl: 0 ?  norethindrone-ethinyl estradiol (NECON) 0.5-35 MG-MCG tablet, Take 1 tablet by mouth daily., Disp: 28 tablet, Rfl: 6 ?  potassium chloride SA (KLOR-CON M20) 20 MEQ tablet, TAKE 1  BY MOUTH ONCE DAILY FOR POTASSIUM, Disp: 90 tablet, Rfl: 3 ? ?No Known Allergies ? ?Objective:  ? ?BP 121/70   Pulse 82   Temp 97.9 ?F (36.6 ?C) (Temporal)   Ht 5\' 7"  (1.702 m)   Wt 200 lb (90.7 kg)   BMI 31.32 kg/m?   ? ?Physical Exam ?Vitals reviewed.  ?Constitutional:   ?   General: She is not in acute distress. ?   Appearance: Normal appearance. She is not ill-appearing, toxic-appearing or diaphoretic.  ?HENT:  ?   Head: Normocephalic and atraumatic.  ?Eyes:  ?   General: No scleral icterus.    ?   Right eye: No discharge.     ?   Left eye: No discharge.  ?   Conjunctiva/sclera: Conjunctivae normal.  ?Cardiovascular:  ?   Rate and Rhythm: Normal rate.  ?Pulmonary:  ?   Effort: Pulmonary effort is normal. No respiratory distress.  ?Musculoskeletal:     ?   General: Normal range of motion.  ?   Cervical back: Normal range of motion.  ?Skin: ?   General: Skin is warm  and dry.  ?   Capillary Refill: Capillary refill takes less than 2 seconds.  ?Neurological:  ?   General: No focal deficit present.  ?   Mental Status: She is alert and oriented to person, place, and time. Mental status is at baseline.  ?Psychiatric:     ?   Mood and Affect: Mood normal.     ?   Behavior: Behavior normal.     ?   Thought Content: Thought content normal.     ?   Judgment: Judgment normal.  ? ? ? ? ? ? ?

## 2021-09-20 LAB — NUSWAB VAGINITIS PLUS (VG+)
Candida albicans, NAA: NEGATIVE
Candida glabrata, NAA: NEGATIVE

## 2021-10-03 ENCOUNTER — Encounter: Payer: Self-pay | Admitting: Family Medicine

## 2021-10-13 ENCOUNTER — Encounter (HOSPITAL_COMMUNITY): Payer: 59 | Admitting: Hematology

## 2021-10-19 ENCOUNTER — Encounter: Payer: Self-pay | Admitting: Family Medicine

## 2021-10-20 ENCOUNTER — Encounter (HOSPITAL_COMMUNITY): Payer: 59 | Admitting: Hematology

## 2021-11-03 ENCOUNTER — Ambulatory Visit: Payer: 59 | Admitting: Family Medicine

## 2021-12-08 ENCOUNTER — Ambulatory Visit: Payer: 59 | Admitting: Family Medicine

## 2021-12-08 ENCOUNTER — Encounter: Payer: Self-pay | Admitting: Family Medicine

## 2021-12-08 VITALS — BP 124/79 | HR 94 | Temp 97.9°F | Ht 67.0 in | Wt 201.6 lb

## 2021-12-08 DIAGNOSIS — Z30019 Encounter for initial prescription of contraceptives, unspecified: Secondary | ICD-10-CM | POA: Diagnosis not present

## 2021-12-08 DIAGNOSIS — L408 Other psoriasis: Secondary | ICD-10-CM | POA: Diagnosis not present

## 2021-12-08 MED ORDER — MEDROXYPROGESTERONE ACETATE 150 MG/ML IM SUSP: 150.0000 mg | INTRAMUSCULAR | 0 refills | Status: DC

## 2021-12-08 MED ORDER — BETAMETHASONE DIPROPIONATE 0.05 % EX CREA: TOPICAL_CREAM | Freq: Two times a day (BID) | CUTANEOUS | 0 refills | Status: DC

## 2021-12-08 NOTE — Progress Notes (Unsigned)
Assessment & Plan:  1. Psoriasis with pustules Uncontrolled, rx'd Betamethasone. Education provided on psoriasis.  - betamethasone dipropionate 0.05 % cream; Apply topically 2 (two) times daily.  Dispense: 45 g; Refill: 0  2. Encounter for female birth control - medroxyPROGESTERone (DEPO-PROVERA) 150 MG/ML injection; Inject 1 mL (150 mg total) into the muscle every 3 (three) months.  Dispense: 1 mL; Refill: 0   Follow up plan: Return for annual physical.  Kathleen Boston, MSN, APRN, FNP-C [facility]  Subjective:   Patient ID: Kathleen Perez, female    DOB: Nov 15, 1975, 46 y.o.   MRN: [id number]  HPI: Kathleen Perez is a 46 y.o. 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 At that time she was given a cream that was helpful but she no longer has any. She reports a current flare that started two weeks ago involving both hands and feet. She reports pain, peeling skin, and blisters.   Patient is also due for her Depo-Provera.   ROS: Negative unless specifically indicated above in HPI.   Relevant past medical history reviewed and updated as indicated.   Allergies and medications reviewed and updated.   Current Outpatient Medications:    amLODipine (NORVASC) 5 MG tablet, Take 1 tablet (5 mg total) by mouth daily., Disp: 90 tablet, Rfl: 3   hydrochlorothiazide (HYDRODIURIL) 25 MG tablet, Take 1 tablet (25 mg total) by mouth daily., Disp: 90 tablet, Rfl: 3   medroxyPROGESTERone (DEPO-PROVERA) 150 MG/ML injection, Inject 1 mL (150 mg total) into the muscle every 3 (three) months., Disp: 1 mL, Rfl: 0   ondansetron (ZOFRAN-ODT) 4 MG disintegrating tablet, Take 1 tablet (4 mg total) by mouth every 8 (eight) hours as needed for nausea or vomiting., Disp: 30 tablet, Rfl: 0   potassium chloride SA (KLOR-CON M20) 20 MEQ tablet, TAKE 1  BY MOUTH ONCE DAILY  FOR POTASSIUM, Disp: 90 tablet, Rfl: 3   norethindrone-ethinyl estradiol (NECON) 0.5-35 MG-MCG tablet, Take 1 tablet by mouth daily. (Patient not taking: Reported on 12/08/2021), Disp: 28 tablet, Rfl: 6  No Known Allergies  Objective:   BP 124/79   Pulse 94   Temp 97.9 F (36.6 C) (Temporal)   Ht 5\' 7"  (1.702 m)   Wt 201 lb 9.6 oz (91.4 kg)   LMP 12/08/2021   SpO2 94%   BMI 31.58 kg/m    Physical Exam Vitals reviewed.  Constitutional:      General: She is not in acute distress.    Appearance: Normal appearance. She is not ill-appearing, toxic-appearing or diaphoretic.  HENT:     Head: Normocephalic and atraumatic.  Eyes:     General: No scleral icterus.       Right eye: No discharge.        Left eye: No discharge.     Conjunctiva/sclera: Conjunctivae normal.  Cardiovascular:     Rate and Rhythm: Normal rate.  Pulmonary:     Effort: Pulmonary effort is normal. No respiratory distress.  Musculoskeletal:        General: Normal range of motion.     Cervical back: Normal range of motion.  Skin:    General: Skin is warm and dry.     Capillary Refill: Capillary refill takes less than 2 seconds.     Comments: Bilateral hands and feet with small red spots that patient reports are where blisters were previously,  small blisters, scaly, erythematous patches.  Neurological:     General: No focal deficit present.     Mental Status: She is alert and oriented to person, place, and time. Mental status is at baseline.  Psychiatric:        Mood and Affect: Mood normal.        Behavior: Behavior normal.        Thought Content: Thought content normal.        Judgment: Judgment normal.

## 2021-12-28 ENCOUNTER — Other Ambulatory Visit: Payer: Self-pay | Admitting: Family Medicine

## 2021-12-28 DIAGNOSIS — L408 Other psoriasis: Secondary | ICD-10-CM

## 2022-01-22 ENCOUNTER — Ambulatory Visit (INDEPENDENT_AMBULATORY_CARE_PROVIDER_SITE_OTHER): Payer: 59 | Admitting: Family Medicine

## 2022-01-22 ENCOUNTER — Encounter: Payer: Self-pay | Admitting: Family Medicine

## 2022-01-22 VITALS — BP 108/71 | HR 83 | Temp 98.7°F | Ht 67.0 in | Wt 193.6 lb

## 2022-01-22 DIAGNOSIS — J40 Bronchitis, not specified as acute or chronic: Secondary | ICD-10-CM

## 2022-01-22 DIAGNOSIS — J329 Chronic sinusitis, unspecified: Secondary | ICD-10-CM

## 2022-01-22 MED ORDER — LEVOFLOXACIN 500 MG PO TABS: 500.0000 mg | ORAL_TABLET | Freq: Every day | ORAL | 0 refills | Status: DC

## 2022-01-22 MED ORDER — BENZONATATE 200 MG PO CAPS: 200.0000 mg | ORAL_CAPSULE | Freq: Three times a day (TID) | ORAL | 0 refills | Status: DC | PRN

## 2022-01-22 NOTE — Progress Notes (Addendum)
Chief Complaint  Patient presents with   Cough   Chest Congestion   Fatigue   Nasal Congestion    HPI  Patient presents today for Patient presents with upper respiratory congestion. Rhinorrhea that is frequently purulent. There is moderate sore throat. Patient reports coughing frequently as well.  No sputum noted. There is no fever, chills or sweats. The patient denies being short of breath. Onset was 1 days ago. Gradually worsening. Tried OTCs without improvement. Smoker. Sx like previous bronchitis.   PMH: Smoking status noted ROS: Per HPI  Objective: BP 108/71   Pulse 83   Temp 98.7 F (37.1 C)   Ht 5\' 7"  (1.702 m)   Wt 193 lb 9.6 oz (87.8 kg)   SpO2 96%   BMI 30.32 kg/m  Gen: NAD, alert, cooperative with exam HEENT: NCAT, Nasal passages swollen, red TMS clear CV: RRR, good S1/S2, no murmur Resp: Bronchitis changes with scattered wheezes, non-labored Ext: No edema, warm Neuro: Alert and oriented, No gross deficits  Assessment and plan:  1. Sinobronchitis     Meds ordered this encounter  Medications   levofloxacin (LEVAQUIN) 500 MG tablet    Sig: Take 1 tablet (500 mg total) by mouth daily.    Dispense:  7 tablet    Refill:  0   benzonatate (TESSALON) 200 MG capsule    Sig: Take 1 capsule (200 mg total) by mouth 3 (three) times daily as needed for cough.    Dispense:  20 capsule    Refill:  0    No orders of the defined types were placed in this encounter.   Follow up as needed.  , MD

## 2022-01-23 ENCOUNTER — Other Ambulatory Visit: Payer: 59

## 2022-01-23 ENCOUNTER — Telehealth: Payer: Self-pay | Admitting: Family Medicine

## 2022-01-23 ENCOUNTER — Other Ambulatory Visit: Payer: Self-pay | Admitting: *Deleted

## 2022-01-23 DIAGNOSIS — R509 Fever, unspecified: Secondary | ICD-10-CM

## 2022-01-23 DIAGNOSIS — R52 Pain, unspecified: Secondary | ICD-10-CM

## 2022-01-23 NOTE — Telephone Encounter (Signed)
Needs covid test to return to work - order placed, will come by this morning.

## 2022-01-23 NOTE — Telephone Encounter (Signed)
Okay to order test?

## 2022-01-23 NOTE — Telephone Encounter (Signed)
YEs, but she just started treatment yesterday. Can come for test Thursday or Friday

## 2022-01-23 NOTE — Telephone Encounter (Signed)
PATIENT AWARE, TEST ORDERED

## 2022-02-23 ENCOUNTER — Encounter: Payer: 59 | Admitting: Family Medicine

## 2022-02-26 ENCOUNTER — Other Ambulatory Visit: Payer: Self-pay | Admitting: Family Medicine

## 2022-02-26 DIAGNOSIS — Z30019 Encounter for initial prescription of contraceptives, unspecified: Secondary | ICD-10-CM

## 2022-02-28 ENCOUNTER — Telehealth: Payer: Self-pay | Admitting: Family Medicine

## 2022-02-28 DIAGNOSIS — Z30019 Encounter for initial prescription of contraceptives, unspecified: Secondary | ICD-10-CM

## 2022-02-28 MED ORDER — MEDROXYPROGESTERONE ACETATE 150 MG/ML IM SUSY: PREFILLED_SYRINGE | INTRAMUSCULAR | 0 refills | Status: DC

## 2022-02-28 NOTE — Telephone Encounter (Signed)
Refill sent.

## 2022-02-28 NOTE — Telephone Encounter (Signed)
  Prescription Request  02/28/2022  Is this a "Controlled Substance" medicine? NO  Have you seen your PCP in the last 2 weeks? Pt has appt for pap on 03/23/2022  If YES, route message to pool  -  If NO, patient needs to be scheduled for appointment.  What is the name of the medication or equipment? Depo shot  Have you contacted your pharmacy to request a refill? yes   Which pharmacy would you like this sent to? [facility]    Patient notified that their request is being sent to the clinical staff for review and that they should receive a response within 2 business days.

## 2022-03-14 ENCOUNTER — Encounter: Payer: Self-pay | Admitting: Family Medicine

## 2022-03-14 ENCOUNTER — Telehealth (INDEPENDENT_AMBULATORY_CARE_PROVIDER_SITE_OTHER): Payer: 59 | Admitting: Family Medicine

## 2022-03-14 DIAGNOSIS — R300 Dysuria: Secondary | ICD-10-CM | POA: Diagnosis not present

## 2022-03-14 LAB — MICROSCOPIC EXAMINATION
RBC, Urine: NONE SEEN /hpf (ref 0–2)
Renal Epithel, UA: NONE SEEN /hpf

## 2022-03-14 LAB — URINALYSIS, ROUTINE W REFLEX MICROSCOPIC
Bilirubin, UA: NEGATIVE
Glucose, UA: NEGATIVE
Leukocytes,UA: NEGATIVE
Nitrite, UA: NEGATIVE
RBC, UA: NEGATIVE
Specific Gravity, UA: 1.025 (ref 1.005–1.030)
Urobilinogen, Ur: 0.2 mg/dL (ref 0.2–1.0)
pH, UA: 6 (ref 5.0–7.5)

## 2022-03-14 NOTE — Progress Notes (Signed)
Virtual Visit via telephone Note Due to COVID-19 pandemic this visit was conducted virtually. This visit type was conducted due to national recommendations for restrictions regarding the COVID-19 Pandemic (e.g. social distancing, sheltering in place) in an effort to limit this patient's exposure and mitigate transmission in our community. All issues noted in this document were discussed and addressed.  A physical exam was not performed with this format.   I connected with Kathleen Perez on 03/14/2022 at 1700 by telephone and verified that I am speaking with the correct person using two identifiers. Kathleen Perez is currently located at home and patient is currently with them during visit. The provider, Monia Pouch, FNP is located in their office at time of visit.  I discussed the limitations, risks, security and privacy concerns of performing an evaluation and management service by virtual visit and the availability of in person appointments. I also discussed with the patient that there may be a patient responsible charge related to this service. The patient expressed understanding and agreed to proceed.  Subjective:  Patient ID: Kathleen Perez, female    DOB: 1975/12/08, 46 y.o.   MRN: [id number]  Chief Complaint:  Dysuria   HPI: Kathleen Perez is a 46 y.o. female presenting on 03/14/2022 for Dysuria   Pt reports this morning she had some slight dysuria and pressure with voiding. She states she has not been drinking water over the last several days and does admit to caffeine intake. No fever, chills, flank pain, confusion, weakness, or fatigue.   Dysuria  This is a new problem. The current episode started today. The problem occurs intermittently. The quality of the pain is described as burning. The pain is mild. There has been no fever. She is Not sexually active. There is No history of pyelonephritis. Pertinent negatives include no chills, discharge, flank pain, frequency, hematuria,  hesitancy, nausea, possible pregnancy, sweats, urgency or vomiting. The treatment provided mild relief.     Relevant past medical, surgical, family, and social history reviewed and updated as indicated.  Allergies and medications reviewed and updated.   Past Medical History:  Diagnosis Date   Hypertension    Kidney stone    MRSA (methicillin resistant staph aureus) culture positive    Scoliosis    Urinary tract infection     Past Surgical History:  Procedure Laterality Date   CYSTECTOMY     KIDNEY STONE SURGERY      Social History   Socioeconomic History   Marital status: Married    Spouse name: Not on file   Number of children: Not on file   Years of education: Not on file   Highest education level: Not on file  Occupational History   Not on file  Tobacco Use   Smoking status: Every Day    Packs/day: 1.50    Types: Cigarettes   Smokeless tobacco: Never  Vaping Use   Vaping Use: Never used  Substance and Sexual Activity   Alcohol use: No   Drug use: No   Sexual activity: Yes    Birth control/protection: Condom  Other Topics Concern   Not on file  Social History Narrative   Not on file   Social Determinants of Health   Financial Resource Strain: Not on file  Food Insecurity: Not on file  Transportation Needs: Not on file  Physical Activity: Not on file  Stress: Not on file  Social Connections: Not on file  Intimate Partner Violence: Not on file  Outpatient Encounter Medications as of 03/14/2022  Medication Sig   amLODipine (NORVASC) 5 MG tablet Take 1 tablet (5 mg total) by mouth daily.   benzonatate (TESSALON) 200 MG capsule Take 1 capsule (200 mg total) by mouth 3 (three) times daily as needed for cough.   betamethasone dipropionate 0.05 % cream APPLY  CREAM TOPICALLY TWICE DAILY   hydrochlorothiazide (HYDRODIURIL) 25 MG tablet Take 1 tablet (25 mg total) by mouth daily.   levofloxacin (LEVAQUIN) 500 MG tablet Take 1 tablet (500 mg total) by mouth  daily.   medroxyPROGESTERone Acetate 150 MG/ML SUSY INJECT 1 ML INTO THE MUSCLE EVERY 3 MONTHS   ondansetron (ZOFRAN-ODT) 4 MG disintegrating tablet Take 1 tablet (4 mg total) by mouth every 8 (eight) hours as needed for nausea or vomiting.   potassium chloride SA (KLOR-CON M20) 20 MEQ tablet TAKE 1  BY MOUTH ONCE DAILY FOR POTASSIUM   No facility-administered encounter medications on file as of 03/14/2022.    No Known Allergies  Review of Systems  Constitutional:  Negative for activity change, appetite change, chills, diaphoresis, fatigue, fever and unexpected weight change.  Respiratory:  Negative for cough and shortness of breath.   Cardiovascular:  Negative for chest pain, palpitations and leg swelling.  Gastrointestinal:  Positive for abdominal pain. Negative for abdominal distention, anal bleeding, blood in stool, constipation, diarrhea, nausea, rectal pain and vomiting.  Genitourinary:  Positive for dysuria. Negative for decreased urine volume, difficulty urinating, dyspareunia, enuresis, flank pain, frequency, genital sores, hematuria, hesitancy, menstrual problem, pelvic pain, urgency, vaginal bleeding, vaginal discharge and vaginal pain.  Musculoskeletal:  Negative for back pain.  Neurological:  Negative for dizziness, weakness and headaches.  Psychiatric/Behavioral:  Negative for confusion.   All other systems reviewed and are negative.        Observations/Objective: No vital signs or physical exam, this was a virtual health encounter.  Pt alert and oriented, answers all questions appropriately, and able to speak in full sentences.    Assessment and Plan: [name] was seen today for dysuria.  Diagnoses and all orders for this visit:  Dysuria Urinalysis with trace ketones and 1 protein, otherwise unremarkable. Culture pending, will treat if warranted. Pt aware to increase water intake and avoid bladder irritants such as caffeine. Pt provided work note for today. Aware to  report new, worsening, or persistent symptoms.  -     Urinalysis, Routine w reflex microscopic -     Urine Culture -     Microscopic Examination     Follow Up Instructions: Return if symptoms worsen or fail to improve.    I discussed the assessment and treatment plan with the patient. The patient was provided an opportunity to ask questions and all were answered. The patient agreed with the plan and demonstrated an understanding of the instructions.   The patient was advised to call back or seek an in-person evaluation if the symptoms worsen or if the condition fails to improve as anticipated.  The above assessment and management plan was discussed with the patient. The patient verbalized understanding of and has agreed to the management plan. Patient is aware to call the clinic if they develop any new symptoms or if symptoms persist or worsen. Patient is aware when to return to the clinic for a follow-up visit. Patient educated on when it is appropriate to go to the emergency department.    I provided 15 minutes of time during this telephone encounter.   Kari Baars, FNP-C [facility] [address] [place], [place] [address] [phone] 03/14/2022

## 2022-03-17 LAB — URINE CULTURE

## 2022-03-23 ENCOUNTER — Ambulatory Visit: Payer: 59 | Admitting: Family

## 2022-03-23 ENCOUNTER — Encounter: Payer: Self-pay | Admitting: Family

## 2022-03-23 ENCOUNTER — Other Ambulatory Visit (HOSPITAL_COMMUNITY)
Admission: AD | Admit: 2022-03-23 | Discharge: 2022-03-23 | Disposition: A | Discharge status: Home / Self Care | Payer: 59 | Source: Ambulatory Visit | Attending: Family | Admitting: Family

## 2022-03-23 VITALS — BP 116/68 | HR 84 | Temp 98.0°F | Ht 67.0 in | Wt 195.0 lb

## 2022-03-23 DIAGNOSIS — Z01411 Encounter for gynecological examination (general) (routine) with abnormal findings: Secondary | ICD-10-CM

## 2022-03-23 DIAGNOSIS — Z1159 Encounter for screening for other viral diseases: Secondary | ICD-10-CM

## 2022-03-23 DIAGNOSIS — Z114 Encounter for screening for human immunodeficiency virus [HIV]: Secondary | ICD-10-CM

## 2022-03-23 DIAGNOSIS — Z23 Encounter for immunization: Secondary | ICD-10-CM | POA: Diagnosis not present

## 2022-03-23 DIAGNOSIS — Z0000 Encounter for general adult medical examination without abnormal findings: Secondary | ICD-10-CM

## 2022-03-23 NOTE — Patient Instructions (Addendum)
Pelvic Organ Prolapse Pelvic organ prolapse is a condition in women that involves the stretching, bulging, or dropping of pelvic organs into an abnormal position, past the opening of the vagina. It happens when the muscles and tissues that surround and support pelvic structures become weak or stretched. Pelvic organ prolapse can involve the: Vagina (vaginal prolapse). Uterus (uterine prolapse). Bladder (cystocele). Rectum (rectocele). Intestines (enterocele). When organs other than the vagina are involved, they often bulge into the vagina or protrude from the vagina, depending on how severe the prolapse is. What are the causes? This condition may be caused by: Pregnancy, labor, and childbirth. Past pelvic surgery. Lower levels of the hormone estrogen due to menopause. Consistently lifting more than 50 lb (23 kg). Obesity. Long-term difficulty passing stool (chronic constipation). Long-term, or chronic, cough. Fluid buildup in the abdomen due to certain conditions. What are the signs or symptoms? Symptoms of this condition include: Leaking a little urine (loss of bladder control) when you cough, sneeze, strain, and exercise (stress incontinence). This may be worse immediately after childbirth. It may gradually improve over time. Feeling pressure in your pelvis or vagina. This pressure may increase when you cough or when you are passing stool. A bulge that protrudes from the opening of your vagina. Difficulty passing urine or stool. Pain in your lower back. Pain or discomfort during sex, or decreased interest in sex. Repeated bladder infections (urinary tract infections). Difficulty inserting a tampon. In some people, this condition causes no symptoms. How is this diagnosed? This condition may be diagnosed based on a vaginal and rectal exam. During the exam, you may be asked to cough and strain while you are lying down, sitting, and standing up. Your health care provider will determine if  other tests are required, such as bladder function tests. How is this treated? Treatment for this condition may depend on your symptoms. Treatment may include: Lifestyle changes, such as drinking plenty of fluids and eating foods that are high in fiber. Emptying your bladder at scheduled times (bladder training therapy). This can help reduce or avoid urinary incontinence. Estrogen. This may help mild prolapse by increasing the strength and tone of pelvic floor muscles. Kegel exercises. These may help mild cases of prolapse by strengthening and tightening the muscles of the pelvic floor. A soft, flexible device that helps support the vaginal walls and keep pelvic organs in place (pessary). This is inserted into your vagina by your health care provider. Surgery. This is often the only form of treatment for severe prolapse. Follow these instructions at home: Eating and drinking Avoid drinking beverages that contain caffeine or alcohol. Increase your intake of high-fiber foods to decrease constipation and straining during bowel movements. Activity Lose weight if recommended by your health care provider. Avoid heavy lifting and straining with exercise and work. Do not hold your breath when you perform mild to moderate lifting and exercise activities. Limit your activities as directed by your health care provider. Do Kegel exercises as directed by your health care provider. To do this: Squeeze your pelvic floor muscles tight. You should feel a tight lift in your rectal area and a tightness in your vaginal area. Keep your stomach, buttocks, and legs relaxed. Hold the muscles tight for up to 10 seconds. Then relax your muscles. Repeat this exercise 50 times a day, or as much as told by your health care provider. Continue to do this exercise for at least 4-6 weeks, or for as long as told by your health care provider.   General instructions Take over-the-counter and prescription medicines only as told by  your health care provider. Wear a sanitary pad or adult diapers if you have urinary incontinence. If you have a pessary, take care of it as told by your health care provider. Keep all follow-up visits. This is important. Contact a health care provider if you: Have symptoms that interfere with your daily activities or sex life. Need medicine to help with the discomfort. Notice bleeding from your vagina that is not related to your menstrual period. Have a fever. Have pain or bleeding when you urinate. Have bleeding when you pass stool. Pass urine when you have sex. Have chronic constipation. Have a pessary that falls out. Have a foul-smelling vaginal discharge. Have an unusual, low pain in your abdomen. Get help right away if you: Cannot pass urine. Summary Pelvic organ prolapse is the stretching, bulging, or dropping of pelvic organs into an abnormal position. It happens when the muscles and tissues that surround and support pelvic structures become weak or stretched. When organs other than the vagina are involved, they often bulge into the vagina or protrude from it, depending on how severe the prolapse is. In most cases, this condition needs to be treated only if it produces symptoms. Treatment may include lifestyle changes, estrogen, Kegel exercises, pessary insertion, or surgery. Avoid heavy lifting and straining with exercise and work. Do not hold your breath when you perform mild to moderate lifting and exercise activities. Limit your activities as directed by your health care provider. This information is not intended to replace advice given to you by your health care provider. Make sure you discuss any questions you have with your health care provider. Document Revised: 12/07/2019 Document Reviewed: 12/07/2019 Elsevier Patient Education  2023 [employer] Inc[employer]  

## 2022-03-23 NOTE — Progress Notes (Signed)
Subjective:    Patient ID: Kathleen Perez, female    DOB: [birth year], 46 y.o.   MRN: [id number]  Chief Complaint  Patient presents with   Gynecologic Exam    Flu today   PT presents to the office today for CPE with pap. Her PCP is Dr. Livia Snellen.   She reports she feels a "lump" on her right vaginal canal after inserting a tampon. Denies any pain or discharge.  She is currently on depo Provera every 3 months. Last injection  03/09/22. Gynecologic Exam The patient's pertinent negatives include no genital itching, genital lesions, genital odor, vaginal bleeding or vaginal discharge. The patient is experiencing no pain.  Nicotine Dependence Presents for follow-up visit. Her urge triggers include company of smokers. The symptoms have been stable. Number of cigarettes per day: vaping throughout day.      Review of Systems  Genitourinary:  Negative for vaginal discharge.  All other systems reviewed and are negative.  History reviewed. No pertinent family history. Social History   Socioeconomic History   Marital status: Married    Spouse name: Not on file   Number of children: Not on file   Years of education: Not on file   Highest education level: Not on file  Occupational History   Not on file  Tobacco Use   Smoking status: Every Day    Packs/day: 1.50    Types: Cigarettes   Smokeless tobacco: Never  Vaping Use   Vaping Use: Never used  Substance and Sexual Activity   Alcohol use: No   Drug use: No   Sexual activity: Yes    Birth control/protection: Condom  Other Topics Concern   Not on file  Social History Narrative   Not on file   Social Determinants of Health   Financial Resource Strain: Not on file  Food Insecurity: Not on file  Transportation Needs: Not on file  Physical Activity: Not on file  Stress: Not on file  Social Connections: Not on file        Objective:   Physical Exam Vitals reviewed.  Constitutional:      General: She is not in acute  distress.    Appearance: She is well-developed.  HENT:     Head: Normocephalic and atraumatic.     Right Ear: Tympanic membrane normal.     Left Ear: Tympanic membrane normal.  Eyes:     Pupils: Pupils are equal, round, and reactive to light.  Neck:     Thyroid: No thyromegaly.  Cardiovascular:     Rate and Rhythm: Normal rate and regular rhythm.     Heart sounds: Normal heart sounds. No murmur heard. Pulmonary:     Effort: Pulmonary effort is normal. No respiratory distress.     Breath sounds: Normal breath sounds. No wheezing.  Abdominal:     General: Bowel sounds are normal. There is no distension.     Palpations: Abdomen is soft.     Tenderness: There is no abdominal tenderness.  Genitourinary:    General: Normal vulva.     Comments: Bimanual exam- no adnexal masses or tenderness, ovaries nonpalpable   Cervix parous and pink- No discharge  Uterine prolapse present  Musculoskeletal:        General: No tenderness. Normal range of motion.     Cervical back: Normal range of motion and neck supple.  Skin:    General: Skin is warm and dry.  Neurological:     Mental Status: She is alert  and oriented to person, place, and time.     Cranial Nerves: No cranial nerve deficit.     Deep Tendon Reflexes: Reflexes are normal and symmetric.  Psychiatric:        Behavior: Behavior normal.        Thought Content: Thought content normal.        Judgment: Judgment normal.       BP 116/68   Pulse 84   Temp 98 F (36.7 C) (Temporal)   Ht _0  (1.702 m)   Wt 195 lb (88.5 kg)   BMI 30.54 kg/m      Assessment & Plan:   KRISTYNE WOODRING comes in today with chief complaint of Gynecologic Exam (Flu today)   Diagnosis and orders addressed:  1. Need for immunization against influenza - Flu Vaccine QUAD 67moIM (Fluarix, Fluzone & Alfiuria Quad PF) - CMP14+EGFR - CBC with Differential/Platelet  2. Annual physical exam - CMP14+EGFR - CBC with Differential/Platelet - Lipid  panel - TSH - HIV Antibody (routine testing w rflx) - Hepatitis C antibody - Cytology - PAP([place])  3. Encounter for gynecological examination with abnormal finding - CMP14+EGFR - CBC with Differential/Platelet - Cytology - PAP([place])  4. Need for hepatitis C screening test - CMP14+EGFR - CBC with Differential/Platelet - Hepatitis C antibody  5. Encounter for screening for HIV - CMP14+EGFR - CBC with Differential/Platelet - HIV Antibody (routine testing w rflx)   Labs pending Health Maintenance reviewed Diet and exercise encouraged  Follow up plan: Keep follow up with PCP  CEvelina Dun FNP

## 2022-03-24 LAB — CMP14+EGFR
ALT: 25 IU/L (ref 0–32)
AST: 18 IU/L (ref 0–40)
Albumin/Globulin Ratio: 1.7 (ref 1.2–2.2)
Albumin: 4.3 g/dL (ref 3.9–4.9)
Alkaline Phosphatase: 82 IU/L (ref 44–121)
BUN/Creatinine Ratio: 21 (ref 9–23)
BUN: 14 mg/dL (ref 6–24)
Bilirubin Total: 0.2 mg/dL (ref 0.0–1.2)
CO2: 21 mmol/L (ref 20–29)
Calcium: 9.7 mg/dL (ref 8.7–10.2)
Chloride: 107 mmol/L — ABNORMAL HIGH (ref 96–106)
Creatinine, Ser: 0.68 mg/dL (ref 0.57–1.00)
Globulin, Total: 2.6 g/dL (ref 1.5–4.5)
Glucose: 82 mg/dL (ref 70–99)
Potassium: 4 mmol/L (ref 3.5–5.2)
Sodium: 143 mmol/L (ref 134–144)
Total Protein: 6.9 g/dL (ref 6.0–8.5)
eGFR: 110 mL/min/{1.73_m2} (ref >59)

## 2022-03-24 LAB — CBC WITH DIFFERENTIAL/PLATELET
Basophils Absolute: 0.1 10*3/uL (ref 0.0–0.2)
Basos: 2 %
EOS (ABSOLUTE): 0.6 10*3/uL — ABNORMAL HIGH (ref 0.0–0.4)
Eos: 7 %
Hematocrit: 42.5 % (ref 34.0–46.6)
Hemoglobin: 14.4 g/dL (ref 11.1–15.9)
Immature Grans (Abs): 0 10*3/uL (ref 0.0–0.1)
Immature Granulocytes: 0 %
Lymphocytes Absolute: 2.9 10*3/uL (ref 0.7–3.1)
Lymphs: 32 %
MCH: 29 pg (ref 26.6–33.0)
MCHC: 33.9 g/dL (ref 31.5–35.7)
MCV: 86 fL (ref 79–97)
Monocytes Absolute: 1 10*3/uL — ABNORMAL HIGH (ref 0.1–0.9)
Monocytes: 11 %
Neutrophils Absolute: 4.2 10*3/uL (ref 1.4–7.0)
Neutrophils: 48 %
Platelets: 334 10*3/uL (ref 150–450)
RBC: 4.97 x10E6/uL (ref 3.77–5.28)
RDW: 13.5 % (ref 11.7–15.4)
WBC: 8.8 10*3/uL (ref 3.4–10.8)

## 2022-03-24 LAB — LIPID PANEL
Chol/HDL Ratio: 5.9 ratio — ABNORMAL HIGH (ref 0.0–4.4)
Cholesterol, Total: 154 mg/dL (ref 100–199)
HDL: 26 mg/dL — ABNORMAL LOW (ref >39)
LDL Chol Calc (NIH): 90 mg/dL (ref 0–99)
Triglycerides: 220 mg/dL — ABNORMAL HIGH (ref 0–149)
VLDL Cholesterol Cal: 38 mg/dL (ref 5–40)

## 2022-03-24 LAB — TSH: TSH: 1.28 u[IU]/mL (ref 0.450–4.500)

## 2022-03-24 LAB — HEPATITIS C ANTIBODY: Hep C Virus Ab: NONREACTIVE

## 2022-03-24 LAB — HIV ANTIBODY (ROUTINE TESTING W REFLEX): HIV Screen 4th Generation wRfx: NONREACTIVE

## 2022-03-26 NOTE — Progress Notes (Signed)
Patient returning call. Please call back,.

## 2022-03-27 LAB — CYTOLOGY - PAP: Diagnosis: NEGATIVE

## 2022-04-18 ENCOUNTER — Telehealth (INDEPENDENT_AMBULATORY_CARE_PROVIDER_SITE_OTHER): Payer: 59 | Admitting: Nurse Practitioner

## 2022-04-18 ENCOUNTER — Encounter: Payer: Self-pay | Admitting: Nurse Practitioner

## 2022-04-18 DIAGNOSIS — K219 Gastro-esophageal reflux disease without esophagitis: Secondary | ICD-10-CM | POA: Diagnosis not present

## 2022-04-18 MED ORDER — OMEPRAZOLE 20 MG PO CPDR: 20.0000 mg | DELAYED_RELEASE_CAPSULE | Freq: Every day | ORAL | 3 refills | Status: DC

## 2022-04-18 NOTE — Progress Notes (Signed)
   Virtual Visit  Note Due to COVID-19 pandemic this visit was conducted virtually. This visit type was conducted due to national recommendations for restrictions regarding the COVID-19 Pandemic (e.g. social distancing, sheltering in place) in an effort to limit this patient's exposure and mitigate transmission in our community. All issues noted in this document were discussed and addressed.  A physical exam was not performed with this format.  I connected with Kathleen Perez on 04/18/22 at 9:10 AM by telephone and verified that I am speaking with the correct person using two identifiers. Kathleen Perez is currently located at home during visit. The provider, Ivy Lynn, NP is located in their office at time of visit.  I discussed the limitations, risks, security and privacy concerns of performing an evaluation and management service by telephone and the availability of in person appointments. I also discussed with the patient that there may be a patient responsible charge related to this service. The patient expressed understanding and agreed to proceed.   History and Present Illness:  Gastroesophageal Reflux She complains of heartburn. She reports no abdominal pain, no belching, no chest pain, no choking, no coughing or no dysphagia. This is a recurrent problem. The current episode started more than 1 month ago. The problem occurs constantly. The problem has been unchanged. The heartburn duration is less than a minute. The heartburn is located in the substernum. The heartburn is of moderate intensity. The heartburn does not wake her from sleep. The heartburn does not limit her activity. The symptoms are aggravated by certain foods and stress. There are no known risk factors. Treatments tried: OTC Tums. The treatment provided no relief.      Review of Systems  Constitutional: Negative.  Negative for chills and fever.  HENT: Negative.    Respiratory:  Negative for cough and choking.    Cardiovascular:  Negative for chest pain.  Gastrointestinal:  Positive for heartburn. Negative for abdominal pain and dysphagia.  Skin: Negative.  Negative for rash.  All other systems reviewed and are negative.    Observations/Objective: Televisit patient not in distress.  Assessment and Plan: Patient presents with symptoms of heartburn, patient has been using over-the-counter Tums with no therapeutic effect.  Patient denies fever, nausea vomiting and abdominal pain.  Started patient on omeprazole 20 mg tablet by mouth daily.  Avoid greasy, spicy, and acidic foods.  Sit up 3 to 4 hours every meal before laying down.  Follow-up with unresolved symptoms.  Follow Up Instructions: Follow-up with unresolved symptoms    I discussed the assessment and treatment plan with the patient. The patient was provided an opportunity to ask questions and all were answered. The patient agreed with the plan and demonstrated an understanding of the instructions.   The patient was advised to call back or seek an in-person evaluation if the symptoms worsen or if the condition fails to improve as anticipated.  The above assessment and management plan was discussed with the patient. The patient verbalized understanding of and has agreed to the management plan. Patient is aware to call the clinic if symptoms persist or worsen. Patient is aware when to return to the clinic for a follow-up visit. Patient educated on when it is appropriate to go to the emergency department.   Time call ended: 9:20 AM  I provided 10 minutes of  non face-to-face time during this encounter.    Ivy Lynn, NP

## 2022-06-29 ENCOUNTER — Telehealth: Payer: Self-pay | Admitting: Family Medicine

## 2022-06-29 NOTE — Telephone Encounter (Signed)
Will need to be seen

## 2022-06-29 NOTE — Telephone Encounter (Signed)
Pt currently taking the depo shot. Says she's not due to have another shot until feb but says she has been spotting and having a lot of hot flashes. Wants to know if that is normal?

## 2022-07-03 NOTE — Telephone Encounter (Signed)
Patient aware that she needs an appt. Patient is going to look at her work schedule and call back to make appt.

## 2022-07-12 ENCOUNTER — Telehealth: Payer: Self-pay

## 2022-07-13 ENCOUNTER — Encounter: Payer: Self-pay | Admitting: Family

## 2022-07-13 ENCOUNTER — Ambulatory Visit: Payer: 59 | Admitting: Family

## 2022-07-13 VITALS — BP 117/80 | HR 89 | Temp 98.3°F | Ht 67.0 in | Wt 202.0 lb

## 2022-07-13 DIAGNOSIS — J019 Acute sinusitis, unspecified: Secondary | ICD-10-CM | POA: Diagnosis not present

## 2022-07-13 DIAGNOSIS — K429 Umbilical hernia without obstruction or gangrene: Secondary | ICD-10-CM | POA: Diagnosis not present

## 2022-07-13 MED ORDER — AMOXICILLIN-POT CLAVULANATE 875-125 MG PO TABS: 1.0000 | ORAL_TABLET | Freq: Two times a day (BID) | ORAL | 0 refills | Status: DC

## 2022-07-13 NOTE — Progress Notes (Signed)
Subjective:    Patient ID: Kathleen Perez, female    DOB: 03/28/1976, 47 y.o.   MRN: [id number]  Chief Complaint  Patient presents with   Cough    5 days    Hernia   PT presents to the office today sinus pressure and congestion.   Reports she has noticed a bulge around her umbilial area about a month ago. However, yesterday when she cough she noticed it has become larger and sore.  Cough Associated symptoms include headaches. Pertinent negatives include no chills, ear pain, sore throat or shortness of breath.  Sinusitis This is a new problem. The current episode started in the past 7 days. The problem is unchanged. There has been no fever. Her pain is at a severity of 4/10. The pain is moderate. Associated symptoms include congestion, coughing, headaches, sinus pressure, sneezing and swollen glands. Pertinent negatives include no chills, ear pain, shortness of breath or sore throat. Past treatments include oral decongestants and acetaminophen. The treatment provided mild relief.      Review of Systems  Constitutional:  Negative for chills.  HENT:  Positive for congestion, sinus pressure and sneezing. Negative for ear pain and sore throat.   Respiratory:  Positive for cough. Negative for shortness of breath.   Neurological:  Positive for headaches.  All other systems reviewed and are negative.      Objective:   Physical Exam Vitals reviewed.  Constitutional:      General: She is not in acute distress.    Appearance: She is well-developed.  HENT:     Head: Normocephalic and atraumatic.     Right Ear: A middle ear effusion is present. Tympanic membrane is erythematous.  Eyes:     Pupils: Pupils are equal, round, and reactive to light.  Neck:     Thyroid: No thyromegaly.  Cardiovascular:     Rate and Rhythm: Normal rate and regular rhythm.     Heart sounds: Normal heart sounds. No murmur heard. Pulmonary:     Effort: Pulmonary effort is normal. No respiratory distress.      Breath sounds: Normal breath sounds. No wheezing.  Abdominal:     General: Bowel sounds are normal. There is no distension.     Palpations: Abdomen is soft.     Tenderness: There is no abdominal tenderness.     Hernia: A hernia is present. Hernia is present in the umbilical area.  Musculoskeletal:        General: No tenderness. Normal range of motion.     Cervical back: Normal range of motion and neck supple.  Skin:    General: Skin is warm and dry.  Neurological:     Mental Status: She is alert and oriented to person, place, and time.     Cranial Nerves: No cranial nerve deficit.     Deep Tendon Reflexes: Reflexes are normal and symmetric.  Psychiatric:        Behavior: Behavior normal.        Thought Content: Thought content normal.        Judgment: Judgment normal.       BP 117/80   Pulse 89   Temp 98.3 F (36.8 C) (Temporal)   Ht 5\' 7"  (1.702 m)   Wt 202 lb (91.6 kg)   SpO2 98%   BMI 31.64 kg/m      Assessment & Plan:  Kathleen Perez comes in today with chief complaint of Cough (5 days ) and Hernia  Diagnosis and orders addressed:  1. Acute sinusitis, recurrence not specified, unspecified location - Take meds as prescribed - Use a cool mist humidifier  -Use saline nose sprays frequently -Force fluids -For any cough or congestion  Use plain Mucinex- regular strength or max strength is fine -For fever or aces or pains- take tylenol or ibuprofen. -Throat lozenges if help -Follow up if symptoms worsen or do not improve  - amoxicillin-clavulanate (AUGMENTIN) 875-125 MG tablet; Take 1 tablet by mouth 2 (two) times daily.  Dispense: 14 tablet; Refill: 0  2. Umbilical hernia without obstruction and without gangrene Avoid heavy lifting  Red flags discussed to go to ED Referral pending  - Ambulatory referral to [place], FNP

## 2022-07-13 NOTE — Patient Instructions (Signed)
Umbilical Hernia, Adult  A hernia is a bulge of tissue that pushes through an opening between muscles. An umbilical hernia happens in the abdomen, near the belly button (umbilicus). The hernia may contain tissues from the small intestine, large intestine, or fatty tissue covering the intestines. Umbilical hernias in adults tend to get worse over time, and they require surgical treatment. There are different types of umbilical hernias, including: Indirect hernia. This type is located just above or below the umbilicus. It is the most common type of umbilical hernia in adults. Direct hernia. This type forms through an opening formed by the umbilicus. Reducible hernia. This type of hernia comes and goes. It may be visible only when you strain, lift something heavy, or cough. This type of hernia can be pushed back into the abdomen (reduced). Incarcerated hernia. This type traps abdominal tissue inside the hernia. This type of hernia cannot be reduced. Strangulated hernia. This type of hernia cuts off blood flow to the tissues inside the hernia. The tissues can start to die if this happens. This type of hernia requires emergency treatment. What are the causes? An umbilical hernia happens when tissue inside the abdomen presses on a weak area of the abdominal muscles. What increases the risk? You may have a greater risk of this condition if you: Are obese. Have had several pregnancies. Have a buildup of fluid inside your abdomen. Have had surgery that weakens the abdominal muscles. What are the signs or symptoms? The main symptom of this condition is a painless bulge at or near the belly button. A reducible hernia may be visible only when you strain, lift something heavy, or cough. Other symptoms may include: Dull pain. A feeling of pressure. Symptoms of a strangulated hernia may include: Pain that gets increasingly worse. Nausea and vomiting. Pain when pressing on the hernia. Skin over the hernia  becoming red or purple. Constipation. Blood in the stool. How is this diagnosed? This condition may be diagnosed based on: A physical exam. You may be asked to cough or strain while standing. These actions increase the pressure inside your abdomen and can force the hernia through the opening in your muscles. Your health care provider may try to reduce the hernia by pressing on it. Your symptoms and medical history. How is this treated? Surgery is the only treatment for an umbilical hernia. Surgery for a strangulated hernia is done as soon as possible. If you have a small hernia that is not incarcerated, you may need to lose weight before having surgery. Follow these instructions at home: Lose weight, if told by your health care provider. Do not try to push the hernia back in. Watch your hernia for any changes in color or size. Tell your health care provider if any changes occur. You may need to avoid activities that increase pressure on your hernia. Do not lift anything that is heavier than 10 lb (4.5 kg), or the limit that you are told, until your health care provider says that it is safe. Take over-the-counter and prescription medicines only as told by your health care provider. Keep all follow-up visits. This is important. Contact a health care provider if: Your hernia gets larger. Your hernia becomes painful. Get help right away if: You develop sudden, severe pain near the area of your hernia. You have pain as well as nausea or vomiting. You have pain and the skin over your hernia changes color. You develop a fever or chills. Summary A hernia is a bulge of   tissue that pushes through an opening between muscles. An umbilical hernia happens near the belly button. Surgery is the only treatment for an umbilical hernia. Do not try to push your hernia back in. Keep all follow-up visits. This is important. This information is not intended to replace advice given to you by your health care  provider. Make sure you discuss any questions you have with your health care provider. Document Revised: 01/18/2020 Document Reviewed: 01/18/2020 Elsevier Patient Education  2023 [employer]  

## 2022-08-02 ENCOUNTER — Encounter: Payer: Self-pay | Admitting: General Surgery

## 2022-08-02 ENCOUNTER — Ambulatory Visit (INDEPENDENT_AMBULATORY_CARE_PROVIDER_SITE_OTHER): Payer: 59 | Admitting: General Surgery

## 2022-08-02 VITALS — BP 122/76 | HR 78 | Temp 98.6°F | Resp 14 | Ht 67.0 in | Wt 207.0 lb

## 2022-08-02 DIAGNOSIS — K429 Umbilical hernia without obstruction or gangrene: Secondary | ICD-10-CM

## 2022-08-02 NOTE — Progress Notes (Signed)
[facility] History and Physical  Reason for Referral: Umbilical hernia  Referring Physician: Claretta Fraise, MD   Chief Complaint   New Patient (Initial Visit)     Kathleen Perez is a 47 y.o. female.  HPI: Kathleen Perez is a 47 yo who has noticed a bulge in Kathleen Perez umbilicus at times and feelings that are uncomfortable. Kathleen Perez has not noticed it ever getting stuck out or hard. Kathleen Perez is worried about it being there and wants to proceed with repair.   Past Medical History:  Diagnosis Date   Hypertension    Kidney stone    MRSA (methicillin resistant staph aureus) culture positive    Scoliosis    Urinary tract infection     Past Surgical History:  Procedure Laterality Date   CYSTECTOMY     KIDNEY STONE SURGERY      History reviewed. No pertinent family history.  Social History   Tobacco Use   Smoking status: Former    Packs/day: 1.50    Types: Cigarettes, E-cigarettes   Smokeless tobacco: Never  Vaping Use   Vaping Use: Never used  Substance Use Topics   Alcohol use: No   Drug use: No    Medications: I have reviewed the patient's current medications. Allergies as of 08/02/2022   No Known Allergies      Medication List        Accurate as of August 02, 2022  2:26 PM. If you have any questions, ask your nurse or doctor.          STOP taking these medications    amoxicillin-clavulanate 875-125 MG tablet Commonly known as: AUGMENTIN Stopped by: Virl Cagey, MD       TAKE these medications    amLODipine 5 MG tablet Commonly known as: NORVASC Take 1 tablet (5 mg total) by mouth daily.   betamethasone dipropionate 0.05 % cream APPLY  CREAM TOPICALLY TWICE DAILY   hydrochlorothiazide 25 MG tablet Commonly known as: HYDRODIURIL Take 1 tablet (25 mg total) by mouth daily.   medroxyPROGESTERone Acetate 150 MG/ML Susy INJECT 1 ML INTO THE MUSCLE EVERY 3 MONTHS   omeprazole 20 MG capsule Commonly known as: PRILOSEC Take 1 capsule (20  mg total) by mouth daily.   ondansetron 4 MG disintegrating tablet Commonly known as: ZOFRAN-ODT Take 1 tablet (4 mg total) by mouth every 8 (eight) hours as needed for nausea or vomiting.   potassium chloride SA 20 MEQ tablet Commonly known as: Klor-Con M20 TAKE 1  BY MOUTH ONCE DAILY FOR POTASSIUM         ROS:  A comprehensive review of systems was negative except for: Gastrointestinal: positive for uncomfortable at umbilical site Endocrine: positive for tired sluggish  Blood pressure 122/76, pulse 78, temperature 98.6 F (37 C), temperature source Oral, resp. rate 14, height 5\' 7"  (1.702 m), weight 207 lb (93.9 kg), SpO2 95 %. Physical Exam Vitals reviewed.  Constitutional:      Appearance: Normal appearance.  HENT:     Head: Normocephalic.     Nose: Nose normal.     Mouth/Throat:     Mouth: Mucous membranes are moist.  Eyes:     Extraocular Movements: Extraocular movements intact.  Cardiovascular:     Rate and Rhythm: Normal rate and regular rhythm.  Pulmonary:     Effort: Pulmonary effort is normal.     Breath sounds: Rales present.  Abdominal:     General: There is no distension.  Palpations: Abdomen is soft.     Tenderness: There is no abdominal tenderness.     Hernia: A hernia is present. Hernia is present in the umbilical area.     Comments: 0.5 cm defect, fat reduced, tender  Musculoskeletal:        General: Normal range of motion.     Cervical back: Normal range of motion.  Skin:    General: Skin is warm.  Neurological:     General: No focal deficit present.     Mental Status: Kathleen Perez is alert and oriented to person, place, and time.  Psychiatric:        Mood and Affect: Mood normal.        Behavior: Behavior normal.        Judgment: Judgment normal.     Results: None   Assessment & Plan:  SHAKETTA RILL is a 47 y.o. female with a small umbilical hernia <9.4TM.  discussed open repair with possible permanent suture versus mesh given small size.  Discussed risk of bleeding, infection, recurrence.   Kathleen Perez wants to go ahead and proceed.   Kathleen Perez wants to schedule for March. Will update Kathleen Perez H&P that AM.   Virl Cagey 08/02/2022, 2:26 PM

## 2022-09-07 NOTE — Patient Instructions (Signed)
Kathleen Perez  09/07/2022     @PREFPERIOPPHARMACY @   Your procedure is scheduled on  09/13/2022.   Report to [facility] at  0600  A.M.   Call this number if you have problems the morning of surgery:  [phone]  If you experience any cold or flu symptoms such as cough, fever, chills, shortness of breath, etc. between now and your scheduled surgery, please notify us at the above number.   Remember:  Do not eat or drink after midnight.      Take these medicines the morning of surgery with A SIP OF WATER            Amlodipine, omeprazole, zofran (if needed).     Do not wear jewelry, make-up or nail polish.  Do not wear lotions, powders, or perfumes, or deodorant.  Do not shave 48 hours prior to surgery.  Men may shave face and neck.  Do not bring valuables to the hospital.  [name] is not responsible for any belongings or valuables.  Contacts, dentures or bridgework may not be worn into surgery.  Leave your suitcase in the car.  After surgery it may be brought to your room.  For patients admitted to the hospital, discharge time will be determined by your treatment team.  Patients discharged the day of surgery will not be allowed to drive home and must have someone with them for 24 hours.    Special instructions:   DO NOT smoke tobacco or vape for 24 hours before your procedure.  Please read over the following fact sheets that you were given. Pain Booklet, Coughing and Deep Breathing, Surgical Site Infection Prevention, Anesthesia Post-op Instructions, and Care and Recovery After Surgery      Open Hernia Repair, Adult, Care After What can I expect after the procedure? After the procedure, it is common to have: Mild discomfort. Slight bruising. Mild swelling. Pain in the belly (abdomen). A small amount of blood from the cut from surgery (incision). Follow these instructions at home: Your doctor may give you more specific instructions. If you have  problems, call your doctor. Medicines Take over-the-counter and prescription medicines only as told by your doctor. If told, take steps to prevent problems with pooping (constipation). You may need to: Drink enough fluid to keep your pee (urine) pale yellow. Take medicines. You will be told what medicines to take. Eat foods that are high in fiber. These include beans, whole grains, and fresh fruits and vegetables. Limit foods that are high in fat and sugar. These include fried or sweet foods. Ask your doctor if you should avoid driving or using machines while you are taking your medicine. Incision care  Follow instructions from your doctor about how to take care of your incision. Make sure you: Wash your hands with soap and water for at least 20 seconds before and after you change your bandage (dressing). If you cannot use soap and water, use hand sanitizer. Change your bandage. Leave stitches or skin glue in place for at least 2 weeks. Leave tape strips alone unless you are told to take them off. You may trim the edges of the tape strips if they curl up. Check your incision every day for signs of infection. Check for: More redness, swelling, or pain. More fluid or blood. Warmth. Pus or a bad smell. Wear loose, soft clothing while your incision heals. Activity  Rest as told by your doctor. Do not lift  anything that is heavier than 10 lb (4.5 kg), or the limit that you are told. Do not play contact sports until your doctor says that this is safe. If you were given a sedative during your procedure, do not drive or use machines until your doctor says that it is safe. A sedative is a medicine that helps you relax. Return to your normal activities when your doctor says that it is safe. General instructions Do not take baths, swim, or use a hot tub. Ask your doctor about taking showers or sponge baths. Hold a pillow over your belly when you cough or sneeze. This helps with pain. Do not  smoke or use any products that contain nicotine or tobacco. If you need help quitting, ask your doctor. Keep all follow-up visits. Contact a doctor if: You have any of these signs of infection in or around your incision: More redness, swelling, or pain. More fluid or blood. Warmth. Pus. A bad smell. You have a fever or chills. You have blood in your poop (stool). You have not pooped (had a bowel movement) in 2-3 days. Medicine does not help your pain. Get help right away if: You have chest pain, or you are short of breath. You feel faint or light-headed. You have very bad pain. You vomit and your pain is worse. You have pain, swelling, or redness in a leg. These symptoms may be an emergency. Get help right away. Call your local emergency services (911 in the [place] Do not wait to see if the symptoms will go away. Do not drive yourself to the hospital. Summary After this procedure, it is common to have mild discomfort, slight bruising, and mild swelling. Follow instructions from your doctor about how to take care of your cut from surgery (incision). Check every day for signs of infection. Do not lift heavy objects or play contact sports until your doctor says it is safe. Return to your normal activities as told by your doctor. This information is not intended to replace advice given to you by your health care provider. Make sure you discuss any questions you have with your health care provider. Document Revised: 01/25/2020 Document Reviewed: 01/25/2020 Elsevier Patient Education  [place] Anesthesia, Adult, Care After The following information offers guidance on how to care for yourself after your procedure. Your health care provider may also give you more specific instructions. If you have problems or questions, contact your health care provider. What can I expect after the procedure? After the procedure, it is common for people to: Have pain or discomfort at the  IV site. Have nausea or vomiting. Have a sore throat or hoarseness. Have trouble concentrating. Feel cold or chills. Feel weak, sleepy, or tired (fatigue). Have soreness and body aches. These can affect parts of the body that were not involved in surgery. Follow these instructions at home: For the time period you were told by your health care provider:  Rest. Do not participate in activities where you could fall or become injured. Do not drive or use machinery. Do not drink alcohol. Do not take sleeping pills or medicines that cause drowsiness. Do not make important decisions or sign legal documents. Do not take care of children on your own. General instructions Drink enough fluid to keep your urine pale yellow. If you have sleep apnea, surgery and certain medicines can increase your risk for breathing problems. Follow instructions from your health care provider about wearing your sleep device: Anytime you are sleeping,  including during daytime naps. While taking prescription pain medicines, sleeping medicines, or medicines that make you drowsy. Return to your normal activities as told by your health care provider. Ask your health care provider what activities are safe for you. Take over-the-counter and prescription medicines only as told by your health care provider. Do not use any products that contain nicotine or tobacco. These products include cigarettes, chewing tobacco, and vaping devices, such as e-cigarettes. These can delay incision healing after surgery. If you need help quitting, ask your health care provider. Contact a health care provider if: You have nausea or vomiting that does not get better with medicine. You vomit every time you eat or drink. You have pain that does not get better with medicine. You cannot urinate or have bloody urine. You develop a skin rash. You have a fever. Get help right away if: You have trouble breathing. You have chest pain. You vomit  blood. These symptoms may be an emergency. Get help right away. Call 911. Do not wait to see if the symptoms will go away. Do not drive yourself to the hospital. Summary After the procedure, it is common to have a sore throat, hoarseness, nausea, vomiting, or to feel weak, sleepy, or fatigue. For the time period you were told by your health care provider, do not drive or use machinery. Get help right away if you have difficulty breathing, have chest pain, or vomit blood. These symptoms may be an emergency. This information is not intended to replace advice given to you by your health care provider. Make sure you discuss any questions you have with your health care provider. Document Revised: 09/08/2021 Document Reviewed: 09/08/2021 Elsevier Patient Education  [place]. How to Use Chlorhexidine Before Surgery Chlorhexidine gluconate (CHG) is a germ-killing (antiseptic) solution that is used to clean the skin. It can get rid of the bacteria that normally live on the skin and can keep them away for about 24 hours. To clean your skin with CHG, you may be given: A CHG solution to use in the shower or as part of a sponge bath. A prepackaged cloth that contains CHG. Cleaning your skin with CHG may help lower the risk for infection: While you are staying in the intensive care unit of the hospital. If you have a vascular access, such as a central line, to provide short-term or long-term access to your veins. If you have a catheter to drain urine from your bladder. If you are on a ventilator. A ventilator is a machine that helps you breathe by moving air in and out of your lungs. After surgery. What are the risks? Risks of using CHG include: A skin reaction. Hearing loss, if CHG gets in your ears and you have a perforated eardrum. Eye injury, if CHG gets in your eyes and is not rinsed out. The CHG product catching fire. Make sure that you avoid smoking and flames after applying CHG to your  skin. Do not use CHG: If you have a chlorhexidine allergy or have previously reacted to chlorhexidine. On babies younger than 71 months of age. How to use CHG solution Use CHG only as told by your health care provider, and follow the instructions on the label. Use the full amount of CHG as directed. Usually, this is one bottle. During a shower Follow these steps when using CHG solution during a shower (unless your health care provider gives you different instructions): Start the shower. Use your normal soap and shampoo to wash  your face and hair. Turn off the shower or move out of the shower stream. Pour the CHG onto a clean washcloth. Do not use any type of brush or rough-edged sponge. Starting at your neck, lather your body down to your toes. Make sure you follow these instructions: If you will be having surgery, pay special attention to the part of your body where you will be having surgery. Scrub this area for at least 1 minute. Do not use CHG on your head or face. If the solution gets into your ears or eyes, rinse them well with water. Avoid your genital area. Avoid any areas of skin that have broken skin, cuts, or scrapes. Scrub your back and under your arms. Make sure to wash skin folds. Let the lather sit on your skin for 1-2 minutes or as long as told by your health care provider. Thoroughly rinse your entire body in the shower. Make sure that all body creases and crevices are rinsed well. Dry off with a clean towel. Do not put any substances on your body afterward--such as powder, lotion, or perfume--unless you are told to do so by your health care provider. Only use lotions that are recommended by the manufacturer. Put on clean clothes or pajamas. If it is the night before your surgery, sleep in clean sheets.  During a sponge bath Follow these steps when using CHG solution during a sponge bath (unless your health care provider gives you different instructions): Use your normal  soap and shampoo to wash your face and hair. Pour the CHG onto a clean washcloth. Starting at your neck, lather your body down to your toes. Make sure you follow these instructions: If you will be having surgery, pay special attention to the part of your body where you will be having surgery. Scrub this area for at least 1 minute. Do not use CHG on your head or face. If the solution gets into your ears or eyes, rinse them well with water. Avoid your genital area. Avoid any areas of skin that have broken skin, cuts, or scrapes. Scrub your back and under your arms. Make sure to wash skin folds. Let the lather sit on your skin for 1-2 minutes or as long as told by your health care provider. Using a different clean, wet washcloth, thoroughly rinse your entire body. Make sure that all body creases and crevices are rinsed well. Dry off with a clean towel. Do not put any substances on your body afterward--such as powder, lotion, or perfume--unless you are told to do so by your health care provider. Only use lotions that are recommended by the manufacturer. Put on clean clothes or pajamas. If it is the night before your surgery, sleep in clean sheets. How to use CHG prepackaged cloths Only use CHG cloths as told by your health care provider, and follow the instructions on the label. Use the CHG cloth on clean, dry skin. Do not use the CHG cloth on your head or face unless your health care provider tells you to. When washing with the CHG cloth: Avoid your genital area. Avoid any areas of skin that have broken skin, cuts, or scrapes. Before surgery Follow these steps when using a CHG cloth to clean before surgery (unless your health care provider gives you different instructions): Using the CHG cloth, vigorously scrub the part of your body where you will be having surgery. Scrub using a back-and-forth motion for 3 minutes. The area on your body should be completely wet  with CHG when you are done  scrubbing. Do not rinse. Discard the cloth and let the area air-dry. Do not put any substances on the area afterward, such as powder, lotion, or perfume. Put on clean clothes or pajamas. If it is the night before your surgery, sleep in clean sheets.  For general bathing Follow these steps when using CHG cloths for general bathing (unless your health care provider gives you different instructions). Use a separate CHG cloth for each area of your body. Make sure you wash between any folds of skin and between your fingers and toes. Wash your body in the following order, switching to a new cloth after each step: The front of your neck, shoulders, and chest. Both of your arms, under your arms, and your hands. Your stomach and groin area, avoiding the genitals. Your right leg and foot. Your left leg and foot. The back of your neck, your back, and your buttocks. Do not rinse. Discard the cloth and let the area air-dry. Do not put any substances on your body afterward--such as powder, lotion, or perfume--unless you are told to do so by your health care provider. Only use lotions that are recommended by the manufacturer. Put on clean clothes or pajamas. Contact a health care provider if: Your skin gets irritated after scrubbing. You have questions about using your solution or cloth. You swallow any chlorhexidine. Call your local poison control center (1-(470) 286-1008 in the [place] Get help right away if: Your eyes itch badly, or they become very red or swollen. Your skin itches badly and is red or swollen. Your hearing changes. You have trouble seeing. You have swelling or tingling in your mouth or throat. You have trouble breathing. These symptoms may represent a serious problem that is an emergency. Do not wait to see if the symptoms will go away. Get medical help right away. Call your local emergency services (911 in the [place] Do not drive yourself to the hospital. Summary Chlorhexidine  gluconate (CHG) is a germ-killing (antiseptic) solution that is used to clean the skin. Cleaning your skin with CHG may help to lower your risk for infection. You may be given CHG to use for bathing. It may be in a bottle or in a prepackaged cloth to use on your skin. Carefully follow your health care provider's instructions and the instructions on the product label. Do not use CHG if you have a chlorhexidine allergy. Contact your health care provider if your skin gets irritated after scrubbing. This information is not intended to replace advice given to you by your health care provider. Make sure you discuss any questions you have with your health care provider. Document Revised: 10/09/2021 Document Reviewed: 08/22/2020 Elsevier Patient Education  [place].

## 2022-09-08 ENCOUNTER — Other Ambulatory Visit: Payer: Self-pay | Admitting: Family Medicine

## 2022-09-08 DIAGNOSIS — I10 Essential (primary) hypertension: Secondary | ICD-10-CM

## 2022-09-11 ENCOUNTER — Encounter (HOSPITAL_COMMUNITY): Payer: Self-pay

## 2022-09-11 ENCOUNTER — Encounter (HOSPITAL_COMMUNITY)
Admission: RE | Admit: 2022-09-11 | Discharge: 2022-09-11 | Disposition: A | Discharge status: Home / Self Care | Payer: 59 | Source: Ambulatory Visit | Attending: General Surgery | Admitting: General Surgery

## 2022-09-11 VITALS — BP 165/87 | HR 87 | Temp 97.8°F | Resp 18 | Ht 67.0 in | Wt 207.0 lb

## 2022-09-11 DIAGNOSIS — Z79899 Other long term (current) drug therapy: Secondary | ICD-10-CM | POA: Insufficient documentation

## 2022-09-11 DIAGNOSIS — I10 Essential (primary) hypertension: Secondary | ICD-10-CM | POA: Insufficient documentation

## 2022-09-11 DIAGNOSIS — Z01818 Encounter for other preprocedural examination: Secondary | ICD-10-CM | POA: Insufficient documentation

## 2022-09-11 HISTORY — DX: Personal history of urinary calculi: Z87.442

## 2022-09-11 HISTORY — DX: Gastro-esophageal reflux disease without esophagitis: K21.9

## 2022-09-11 LAB — BASIC METABOLIC PANEL
Anion gap: 9 (ref 5–15)
BUN: 8 mg/dL (ref 6–20)
CO2: 23 mmol/L (ref 22–32)
Calcium: 8.8 mg/dL — ABNORMAL LOW (ref 8.9–10.3)
Chloride: 107 mmol/L (ref 98–111)
Creatinine, Ser: 0.7 mg/dL (ref 0.44–1.00)
GFR, Estimated: >60 mL/min (ref >60)
Glucose, Bld: 121 mg/dL — ABNORMAL HIGH (ref 70–99)
Potassium: 3.3 mmol/L — ABNORMAL LOW (ref 3.5–5.1)
Sodium: 139 mmol/L (ref 135–145)

## 2022-09-11 LAB — POCT PREGNANCY, URINE: Preg Test, Ur: NEGATIVE

## 2022-09-11 NOTE — Telephone Encounter (Signed)
Pt aware these were refilled yesterday 09/10/22

## 2022-09-11 NOTE — Progress Notes (Signed)
Patient lab result showed potassium level was 3.3. Dr. Wyatt Haste made aware and instructed for patient to be notified to hold her hydrochlorothizade tomorrow. Called the patient and left voicemail for patient to hold her medication.

## 2022-09-11 NOTE — Telephone Encounter (Signed)
Patient calling to check on status of these med requests. Says she is having surgery on Thursday. Scheduled an appt to see PCP on 4/2 but needs refills sent in to last her until her appt.   Pt uses [facility] in [place].

## 2022-09-13 ENCOUNTER — Other Ambulatory Visit: Payer: Self-pay

## 2022-09-13 ENCOUNTER — Ambulatory Visit (HOSPITAL_BASED_OUTPATIENT_CLINIC_OR_DEPARTMENT_OTHER): Payer: 59 | Admitting: Anesthesiology

## 2022-09-13 ENCOUNTER — Ambulatory Visit (HOSPITAL_COMMUNITY): Payer: 59 | Admitting: Anesthesiology

## 2022-09-13 ENCOUNTER — Encounter (HOSPITAL_COMMUNITY): Payer: Self-pay | Admitting: General Surgery

## 2022-09-13 ENCOUNTER — Encounter (HOSPITAL_COMMUNITY)
Admission: RE | Disposition: A | Discharge status: Home / Self Care | Payer: Self-pay | Source: Ambulatory Visit | Attending: General Surgery

## 2022-09-13 ENCOUNTER — Ambulatory Visit (HOSPITAL_COMMUNITY)
Admission: RE | Admit: 2022-09-13 | Discharge: 2022-09-13 | Disposition: A | Discharge status: Home / Self Care | Payer: 59 | Source: Ambulatory Visit | Attending: General Surgery | Admitting: General Surgery

## 2022-09-13 DIAGNOSIS — K429 Umbilical hernia without obstruction or gangrene: Secondary | ICD-10-CM | POA: Diagnosis not present

## 2022-09-13 DIAGNOSIS — Z87891 Personal history of nicotine dependence: Secondary | ICD-10-CM | POA: Diagnosis not present

## 2022-09-13 DIAGNOSIS — K219 Gastro-esophageal reflux disease without esophagitis: Secondary | ICD-10-CM | POA: Diagnosis not present

## 2022-09-13 DIAGNOSIS — I10 Essential (primary) hypertension: Secondary | ICD-10-CM | POA: Diagnosis not present

## 2022-09-13 DIAGNOSIS — Z79899 Other long term (current) drug therapy: Secondary | ICD-10-CM | POA: Diagnosis not present

## 2022-09-13 HISTORY — PX: UMBILICAL HERNIA REPAIR: SHX196

## 2022-09-13 SURGERY — REPAIR, HERNIA, UMBILICAL, ADULT
Anesthesia: General | Site: Abdomen

## 2022-09-13 MED ORDER — ONDANSETRON HCL 4 MG/2ML IJ SOLN
INTRAMUSCULAR | Status: DC | PRN
  Administered 2022-09-13: 4 mg via INTRAVENOUS

## 2022-09-13 MED ORDER — LIDOCAINE HCL (PF) 2 % IJ SOLN
INTRAMUSCULAR | Status: AC
  Filled 2022-09-13: qty 5

## 2022-09-13 MED ORDER — PROPOFOL 10 MG/ML IV BOLUS
INTRAVENOUS | Status: AC
  Filled 2022-09-13: qty 20

## 2022-09-13 MED ORDER — CHLORHEXIDINE GLUCONATE 0.12 % MT SOLN
OROMUCOSAL | Status: AC
  Filled 2022-09-13: qty 15

## 2022-09-13 MED ORDER — LACTATED RINGERS IV SOLN: INTRAVENOUS | Status: DC

## 2022-09-13 MED ORDER — CHLORHEXIDINE GLUCONATE CLOTH 2 % EX PADS: 6.0000 | MEDICATED_PAD | Freq: Once | CUTANEOUS | Status: DC

## 2022-09-13 MED ORDER — MIDAZOLAM HCL 2 MG/2ML IJ SOLN
INTRAMUSCULAR | Status: AC
  Filled 2022-09-13: qty 2

## 2022-09-13 MED ORDER — ORAL CARE MOUTH RINSE: 15.0000 mL | Freq: Once | OROMUCOSAL | Status: AC

## 2022-09-13 MED ORDER — PHENYLEPHRINE 80 MCG/ML (10ML) SYRINGE FOR IV PUSH (FOR BLOOD PRESSURE SUPPORT)
PREFILLED_SYRINGE | INTRAVENOUS | Status: AC
  Filled 2022-09-13: qty 10

## 2022-09-13 MED ORDER — ROCURONIUM BROMIDE 10 MG/ML (PF) SYRINGE
PREFILLED_SYRINGE | INTRAVENOUS | Status: DC | PRN
  Administered 2022-09-13: 50 mg via INTRAVENOUS

## 2022-09-13 MED ORDER — ONDANSETRON HCL 4 MG/2ML IJ SOLN
INTRAMUSCULAR | Status: AC
  Filled 2022-09-13: qty 2

## 2022-09-13 MED ORDER — CHLORHEXIDINE GLUCONATE 0.12 % MT SOLN
15.0000 mL | Freq: Once | OROMUCOSAL | Status: AC
  Administered 2022-09-13: 15 mL via OROMUCOSAL

## 2022-09-13 MED ORDER — MEPERIDINE HCL 50 MG/ML IJ SOLN: 6.2500 mg | INTRAMUSCULAR | Status: DC | PRN

## 2022-09-13 MED ORDER — PROPOFOL 10 MG/ML IV BOLUS
INTRAVENOUS | Status: DC | PRN
  Administered 2022-09-13: 160 mg via INTRAVENOUS

## 2022-09-13 MED ORDER — FENTANYL CITRATE (PF) 100 MCG/2ML IJ SOLN
INTRAMUSCULAR | Status: AC
  Filled 2022-09-13: qty 2

## 2022-09-13 MED ORDER — BUPIVACAINE LIPOSOME 1.3 % IJ SUSP
INTRAMUSCULAR | Status: AC
  Filled 2022-09-13: qty 20

## 2022-09-13 MED ORDER — ROCURONIUM BROMIDE 10 MG/ML (PF) SYRINGE
PREFILLED_SYRINGE | INTRAVENOUS | Status: AC
  Filled 2022-09-13: qty 10

## 2022-09-13 MED ORDER — DEXAMETHASONE SODIUM PHOSPHATE 4 MG/ML IJ SOLN
INTRAMUSCULAR | Status: DC | PRN
  Administered 2022-09-13: 10 mg via INTRAVENOUS

## 2022-09-13 MED ORDER — ONDANSETRON HCL 4 MG/2ML IJ SOLN: 4.0000 mg | Freq: Once | INTRAMUSCULAR | Status: DC | PRN

## 2022-09-13 MED ORDER — LIDOCAINE 2% (20 MG/ML) 5 ML SYRINGE
INTRAMUSCULAR | Status: DC | PRN
  Administered 2022-09-13: 80 mg via INTRAVENOUS

## 2022-09-13 MED ORDER — FENTANYL CITRATE (PF) 250 MCG/5ML IJ SOLN
INTRAMUSCULAR | Status: DC | PRN
  Administered 2022-09-13 (×4): 50 ug via INTRAVENOUS

## 2022-09-13 MED ORDER — MIDAZOLAM HCL 2 MG/2ML IJ SOLN
INTRAMUSCULAR | Status: DC | PRN
  Administered 2022-09-13: 2 mg via INTRAVENOUS

## 2022-09-13 MED ORDER — ONDANSETRON HCL 4 MG PO TABS: 4.0000 mg | ORAL_TABLET | Freq: Three times a day (TID) | ORAL | 1 refills | Status: DC | PRN

## 2022-09-13 MED ORDER — SODIUM CHLORIDE 0.9 % IR SOLN
Status: DC | PRN
  Administered 2022-09-13: 1000 mL

## 2022-09-13 MED ORDER — HYDROMORPHONE HCL 1 MG/ML IJ SOLN: 0.2500 mg | INTRAMUSCULAR | Status: DC | PRN

## 2022-09-13 MED ORDER — DEXAMETHASONE SODIUM PHOSPHATE 10 MG/ML IJ SOLN
INTRAMUSCULAR | Status: AC
  Filled 2022-09-13: qty 1

## 2022-09-13 MED ORDER — EPHEDRINE 5 MG/ML INJ
INTRAVENOUS | Status: AC
  Filled 2022-09-13: qty 5

## 2022-09-13 MED ORDER — SUGAMMADEX SODIUM 200 MG/2ML IV SOLN
INTRAVENOUS | Status: DC | PRN
  Administered 2022-09-13: 200 mg via INTRAVENOUS

## 2022-09-13 MED ORDER — CEFAZOLIN SODIUM-DEXTROSE 2-4 GM/100ML-% IV SOLN
INTRAVENOUS | Status: AC
  Filled 2022-09-13: qty 100

## 2022-09-13 MED ORDER — BUPIVACAINE LIPOSOME 1.3 % IJ SUSP
INTRAMUSCULAR | Status: DC | PRN
  Administered 2022-09-13: 20 mL

## 2022-09-13 MED ORDER — CEFAZOLIN SODIUM-DEXTROSE 2-4 GM/100ML-% IV SOLN
2.0000 g | INTRAVENOUS | Status: AC
  Administered 2022-09-13: 2 g via INTRAVENOUS

## 2022-09-13 MED ORDER — OXYCODONE HCL 5 MG PO TABS: 5.0000 mg | ORAL_TABLET | ORAL | 0 refills | Status: DC | PRN

## 2022-09-13 SURGICAL SUPPLY — 35 items
ADH SKN CLS APL DERMABOND .7 (GAUZE/BANDAGES/DRESSINGS) ×1
APL PRP STRL LF DISP 70% ISPRP (MISCELLANEOUS) ×1
BLADE SURG 15 STRL LF DISP TIS (BLADE) ×2 IMPLANT
BLADE SURG 15 STRL SS (BLADE) ×1
CHLORAPREP W/TINT 26 (MISCELLANEOUS) ×2 IMPLANT
CLOTH BEACON ORANGE TIMEOUT ST (SAFETY) ×2 IMPLANT
COVER LIGHT HANDLE STERIS (MISCELLANEOUS) ×4 IMPLANT
DERMABOND ADVANCED .7 DNX12 (GAUZE/BANDAGES/DRESSINGS) ×2 IMPLANT
DRSG TEGADERM 2-3/8X2-3/4 SM (GAUZE/BANDAGES/DRESSINGS) IMPLANT
ELECT REM PT RETURN 9FT ADLT (ELECTROSURGICAL) ×1
ELECTRODE REM PT RTRN 9FT ADLT (ELECTROSURGICAL) ×2 IMPLANT
GAUZE SPONGE 2X2 STRL 8-PLY (GAUZE/BANDAGES/DRESSINGS) IMPLANT
GLOVE BIO SURGEON STRL SZ 6.5 (GLOVE) ×2 IMPLANT
GLOVE BIO SURGEON STRL SZ7 (GLOVE) IMPLANT
GLOVE BIOGEL PI IND STRL 6.5 (GLOVE) ×2 IMPLANT
GLOVE BIOGEL PI IND STRL 7.0 (GLOVE) ×4 IMPLANT
GLOVE SURG SS PI 7.0 STRL IVOR (GLOVE) IMPLANT
GOWN STRL REUS W/TWL LRG LVL3 (GOWN DISPOSABLE) ×4 IMPLANT
INST SET MINOR GENERAL (KITS) ×2 IMPLANT
KIT TURNOVER KIT A (KITS) ×2 IMPLANT
MANIFOLD NEPTUNE II (INSTRUMENTS) ×2 IMPLANT
MESH VENTRALEX ST 1-7/10 CRC S (Mesh General) IMPLANT
NDL HYPO 21X1.5 SAFETY (NEEDLE) ×2 IMPLANT
NEEDLE HYPO 21X1.5 SAFETY (NEEDLE) ×1 IMPLANT
NS IRRIG 1000ML POUR BTL (IV SOLUTION) ×2 IMPLANT
PACK MINOR (CUSTOM PROCEDURE TRAY) ×2 IMPLANT
PAD ARMBOARD 7.5X6 YLW CONV (MISCELLANEOUS) ×2 IMPLANT
SET BASIN LINEN APH (SET/KITS/TRAYS/PACK) ×2 IMPLANT
SUT ETHIBOND NAB MO 7 #0 18IN (SUTURE) ×2 IMPLANT
SUT MNCRL AB 4-0 PS2 18 (SUTURE) ×2 IMPLANT
SUT VIC AB 2-0 CT1 27 (SUTURE) ×1
SUT VIC AB 2-0 CT1 TAPERPNT 27 (SUTURE) IMPLANT
SUT VIC AB 3-0 SH 27 (SUTURE) ×1
SUT VIC AB 3-0 SH 27X BRD (SUTURE) ×2 IMPLANT
SYR 20ML LL LF (SYRINGE) ×4 IMPLANT

## 2022-09-13 NOTE — Anesthesia Procedure Notes (Addendum)
Procedure Name: Intubation Date/Time: 09/13/2022 7:36 AM  Performed by: Orlie Dakin, CRNAPre-anesthesia Checklist: Emergency Drugs available, Suction available, Patient identified, Patient being monitored and Timeout performed Patient Re-evaluated:Patient Re-evaluated prior to induction Oxygen Delivery Method: Circle system utilized Preoxygenation: Pre-oxygenation with 100% oxygen Induction Type: IV induction Ventilation: Mask ventilation without difficulty Laryngoscope Size: Mac and 3 Grade View: Grade I Tube type: Oral Tube size: 7.0 mm Number of attempts: 1 Airway Equipment and Method: Patient positioned with wedge pillow and Stylet Placement Confirmation: ETT inserted through vocal cords under direct vision, positive ETCO2 and breath sounds checked- equal and bilateral Secured at: 22 cm Tube secured with: Tape Dental Injury: Teeth and Oropharynx as per pre-operative assessment

## 2022-09-13 NOTE — Op Note (Signed)
[facility] Operative Note  09/13/22  Preoperative Diagnosis: Umbilical hernia    Postoperative Diagnosis: Same   Procedure(s) Performed: Umbilical hernia repair with mesh (defect 1.25cm)    Surgeon: Lanell Matar. Constance Haw, MD   Assistants: No qualified resident was available    Anesthesia: General endotracheal   Anesthesiologist: Dr. Charna Elizabeth, MD    Specimens: None    Estimated Blood Loss: Minimal   Blood Replacement: None    Complications: None   Wound Class: Clean   Operative Indications: Ms. Kathleen Perez is a 47 yo with an umbilical hernia repair with mesh. We discussed risk of bleeding, infection, recurrence, use of mesh.   Findings: Defect 1.25cm with fat   Procedure: The patient was taken to the operating room and placed supine. General endotracheal anesthesia was induced. Intravenous antibiotics were  administered per protocol.  The abdomen was prepared and draped in the usual sterile fashion.   The umbilical hernia was noted to be reducible and measured about 1.25cm. An incision was made under the umbilicus, and carried down through the subcutaneous tissue with electrocautery.  Dissection was performed down to the level of the fascia, exposing the hernia sac.  The hernia sac was opened with care, and excess hernia sac was resected with electrocautery.  A finger was ran on the underlying peritoneum and this was clear.  A 4.3 cm Ventralex St Hernia Patch was placed and secured with 0 Ethibond sutures ensuring that it was against the peritoneal cavity.  The hernia defect was then closed with 0 Ethibond suture in an interrupted fashion over the patch.  The umbilicus was tacked to the fascia with a 3-0 Vicryl suture.   Hemostasis was confirmed. The skin was closed with a running 4-0 Monocryl suture and dermabond.  After the dermabond dried a 2X2 and tegaderm were placed over the umbilicus to act as a pressure dressing.    All counts were correct at the end of the  case. The patient was awakened from anesthesia and extubated without complication.  The patient went to the PACU in stable condition.  Curlene Labrum, MD [facility] [address] [place], [place] [address] [phone] (office)

## 2022-09-13 NOTE — Discharge Instructions (Signed)
Discharge Instructions Hernia:  Common Complaints: Pain at the incision site is common. This will improve with time. Take your pain medications as described below. Some nausea is common and poor appetite. The main goal is to stay hydrated the first few days after surgery.   Diet/ Activity: Diet as tolerated. You may not have an appetite, but it is important to stay hydrated. Drink 64 ounces of water a day. Your appetite will return with time.  Remove the small clear dressing and gauze after two days (48 hours). Trim the gauze off the glue that is underneath if the gauze is stuck to the glue. Shower per your regular routine daily after removing the gauze and clear dressing. You can bird bath before then.  Do not take hot showers. Take warm showers that are less than 10 minutes as the glue may start to peel up.  Rest and listen to your body, but do not remain in bed all day.Walk everyday for at least 15-20 minutes.  Deep cough and move around every 1-2 hours in the first few days after surgery. Do not pick at the dermabond glue on your incision sites.  This glue film will remain in place for 1-2 weeks and will start to peel off. Do not place lotions or balms on your incision unless instructed to specifically by Dr. Constance Haw. Do not lift > 10 lbs, perform excessive bending, pushing, pulling, squatting for 6-8 weeks after surgery. Where your abdominal binder with activity as much as possible. The activity restrictions and the abdominal binder are to prevent hernia formation at your incision while you are healing.   Pain Expectations and Narcotics: -After surgery you will have pain associated with your incisions and this is normal. The pain is muscular and nerve pain, and will get better with time. -You are encouraged and expected to take non narcotic medications like tylenol and ibuprofen (when able) to treat pain as multiple modalities can aid with pain treatment. -Narcotics are only used when pain  is severe or there is breakthrough pain. -You are not expected to have a pain score of 0 after surgery, as we cannot prevent pain. A pain score of 3-4 that allows you to be functional, move, walk, and tolerate some activity is the goal. The pain will continue to improve over the days after surgery and is dependent on your surgery. -Due to [place] law, we are only able to give a certain amount of pain medication to treat post operative pain, and we only give additional narcotics on a patient by patient basis.  -For most laparoscopic surgery, studies have shown that the majority of patients only need 10-15 narcotic pills, and for open surgeries most patients only need 15-20.   -Having appropriate expectations of pain and knowledge of pain management with non narcotics is important as we do not want anyone to become addicted to narcotic pain medication.  -Using ice packs in the first 48 hours and heating pads after 48 hours, wearing an abdominal binder (when recommended), and using over the counter medications are all ways to help with pain management.   -Simple acts like meditation and mindfulness practices after surgery can also help with pain control and research has proven the benefit of these practices.  Medication: Take tylenol and ibuprofen as needed for pain control, alternating every 4-6 hours.  Example:  Tylenol 1000mg  @ 6am, 12noon, 6pm, 67midnight (Do not exceed 4000mg  of tylenol a day). Ibuprofen 800mg  @ 9am, 3pm, 9pm, 3am (Do not exceed  3600mg  of ibuprofen a day).  Take Roxicodone for breakthrough pain every 4 hours.  Take Colace for constipation related to narcotic pain medication. If you do not have a bowel movement in 2 days, take Miralax over the counter.  Drink plenty of water to also prevent constipation.   Contact Information: If you have questions or concerns, please call our office, [phone], Monday- Thursday 8AM-5PM and Friday 8AM-12Noon.  If it is after hours or on the weekend,  please call [facility] Main Number, [phone], [phone], and ask to speak to the surgeon on call for Dr. Constance Haw at [facility].

## 2022-09-13 NOTE — Anesthesia Preprocedure Evaluation (Signed)
Anesthesia Evaluation  Patient identified by MRN, date of birth, ID band Patient awake    Reviewed: Allergy & Precautions, H&P , NPO status , Patient's Chart, lab work & pertinent test results  Airway Mallampati: II  TM Distance: >3 FB Neck ROM: Full    Dental  (+) Dental Advisory Given, Missing   Pulmonary Patient abstained from smoking., former smoker   Pulmonary exam normal breath sounds clear to auscultation       Cardiovascular Exercise Tolerance: Good hypertension, Pt. on medications Normal cardiovascular exam Rhythm:Regular Rate:Normal     Neuro/Psych negative neurological ROS  negative psych ROS   GI/Hepatic Neg liver ROS,GERD  Medicated,,  Endo/Other  negative endocrine ROS    Renal/GU Renal disease  negative genitourinary   Musculoskeletal negative musculoskeletal ROS (+)    Abdominal   Peds negative pediatric ROS (+)  Hematology negative hematology ROS (+)   Anesthesia Other Findings   Reproductive/Obstetrics negative OB ROS                             Anesthesia Physical Anesthesia Plan  ASA: 2  Anesthesia Plan: General   Post-op Pain Management: Dilaudid IV   Induction: Intravenous  PONV Risk Score and Plan: 4 or greater and Ondansetron, Dexamethasone and Midazolam  Airway Management Planned: Oral ETT  Additional Equipment:   Intra-op Plan:   Post-operative Plan: Extubation in OR  Informed Consent: I have reviewed the patients History and Physical, chart, labs and discussed the procedure including the risks, benefits and alternatives for the proposed anesthesia with the patient or authorized representative who has indicated his/her understanding and acceptance.     Dental advisory given  Plan Discussed with: CRNA and Surgeon  Anesthesia Plan Comments:        Anesthesia Quick Evaluation

## 2022-09-13 NOTE — Anesthesia Postprocedure Evaluation (Signed)
Anesthesia Post Note  Patient: Kathleen Perez  Procedure(s) Performed: HERNIA REPAIR UMBILICAL ADULT W/ MESH (Abdomen)  Patient location during evaluation: Phase II Anesthesia Type: General Level of consciousness: awake and alert and oriented Pain management: pain level controlled Vital Signs Assessment: post-procedure vital signs reviewed and stable Respiratory status: spontaneous breathing, nonlabored ventilation and respiratory function stable Cardiovascular status: blood pressure returned to baseline and stable Postop Assessment: no apparent nausea or vomiting Anesthetic complications: no  No notable events documented.   Last Vitals:  Vitals:   09/13/22 0900 09/13/22 0910  BP: 136/88 (!) 140/95  Pulse: 67 66  Resp: 20   Temp:  36.7 C  SpO2: 95% 100%    Last Pain:  Vitals:   09/13/22 0910  TempSrc: Oral  PainSc: 3                  Axl Rodino C Chrysten Woulfe

## 2022-09-13 NOTE — H&P (Signed)
[facility] History and Physical    Kathleen Perez is a 47 y.o. female.  HPI: Patient with 47 yo with a small defect at her umbilicus she wants to get fixed. She is worried about it and notices it.   Past Medical History:  Diagnosis Date   GERD (gastroesophageal reflux disease)    History of kidney stones    Hypertension    Kidney stone    MRSA (methicillin resistant staph aureus) culture positive    Scoliosis    Urinary tract infection     Past Surgical History:  Procedure Laterality Date   CYSTECTOMY     KIDNEY STONE SURGERY      History reviewed. No pertinent family history.  Social History   Tobacco Use   Smoking status: Former    Packs/day: 1.5    Types: Cigarettes, E-cigarettes   Smokeless tobacco: Current  Vaping Use   Vaping Use: Every day  Substance Use Topics   Alcohol use: No   Drug use: No    Medications: I have reviewed the patient's current medications. Current Outpatient Medications  Medication Instructions   amLODipine (NORVASC) 5 mg, Oral, Daily   betamethasone dipropionate 0.05 % cream APPLY  CREAM TOPICALLY TWICE DAILY   hydrochlorothiazide (HYDRODIURIL) 25 mg, Oral, Daily   medroxyPROGESTERone Acetate 150 MG/ML SUSY INJECT 1 ML INTO THE MUSCLE EVERY 3 MONTHS   omeprazole (PRILOSEC) 20 mg, Oral, Daily   ondansetron (ZOFRAN-ODT) 4 mg, Oral, Every 8 hours PRN   potassium chloride SA (KLOR-CON M20) 20 MEQ tablet TAKE 1  BY MOUTH ONCE DAILY FOR POTASSIUM    No Known Allergies    ROS:  A comprehensive review of systems was negative except for: Gastrointestinal: positive for umbilical hernia  Blood pressure 128/89, pulse 70, temperature 98.6 F (37 C), resp. rate 18, last menstrual period 07/18/2022, SpO2 98 %. Physical Exam HENT:     Head: Normocephalic.     Nose: Nose normal.     Mouth/Throat:     Mouth: Mucous membranes are moist.  Eyes:     Extraocular Movements: Extraocular movements intact.  Cardiovascular:      Rate and Rhythm: Normal rate.  Pulmonary:     Effort: Pulmonary effort is normal.  Abdominal:     General: There is no distension.     Palpations: Abdomen is soft.     Tenderness: There is no abdominal tenderness.     Hernia: A hernia is present. Hernia is present in the umbilical area.  Musculoskeletal:        General: Normal range of motion.  Skin:    General: Skin is warm.  Neurological:     General: No focal deficit present.     Mental Status: She is alert and oriented to person, place, and time.  Psychiatric:        Mood and Affect: Mood normal.     Results: Results for orders placed or performed during the hospital encounter of 09/11/22 (from the past 48 hour(s))  Basic metabolic panel     Status: Abnormal   Collection Time: 09/11/22  9:10 AM  Result Value Ref Range   Sodium 139 135 - 145 mmol/L   Potassium 3.3 (L) 3.5 - 5.1 mmol/L   Chloride 107 98 - 111 mmol/L   CO2 23 22 - 32 mmol/L   Glucose, Bld 121 (H) 70 - 99 mg/dL    Comment: Glucose reference range applies only to samples taken after fasting for at least  8 hours.   BUN 8 6 - 20 mg/dL   Creatinine, Ser 0.70 0.44 - 1.00 mg/dL   Calcium 8.8 (L) 8.9 - 10.3 mg/dL   GFR, Estimated >60 >60 mL/min    Comment: (NOTE) Calculated using the CKD-EPI Creatinine Equation (2021)    Anion gap 9 5 - 15    Comment: Performed at [facility], [address] [place], [place] [address]  Pregnancy, urine POC     Status: None   Collection Time: 09/11/22  9:24 AM  Result Value Ref Range   Preg Test, Ur NEGATIVE NEGATIVE    Comment:        THE SENSITIVITY OF THIS METHODOLOGY IS >24 mIU/mL     No results found.   Assessment & Plan:  Kathleen Perez is a 47 y.o. female with ana umbilical hernia that she wants fixed. Discussed primary repair versus with mesh due to her small size. Discussed risk of bleeding, infection, use of mesh, recurrence, reasons for recurrence including diabetes, smoking, and weight gain.    All  questions were answered to the satisfaction of the patient.    Virl Cagey 09/13/2022, 7:20 AM

## 2022-09-13 NOTE — Progress Notes (Addendum)
[facility]  Updated husband. 1.25cm defect and used a piece of mesh. Discussed with her going back to work with restrictions 3/28. If she needs to change this she will update the office. Rx to [facility].   Curlene Labrum, MD [facility] [address] [place], [place] [address] [phone] (office)

## 2022-09-13 NOTE — Transfer of Care (Signed)
Immediate Anesthesia Transfer of Care Note  Patient: Kathleen Perez  Procedure(s) Performed: HERNIA REPAIR UMBILICAL ADULT W/ MESH (Abdomen)  Patient Location: PACU  Anesthesia Type:General  Level of Consciousness: awake and oriented  Airway & Oxygen Therapy: Patient Spontanous Breathing and Patient connected to face mask oxygen  Post-op Assessment: Report given to RN and Post -op Vital signs reviewed and stable  Post vital signs: Reviewed and stable  Last Vitals:  Vitals Value Taken Time  BP    Temp    Pulse 90 09/13/22 0829  Resp 14 09/13/22 0829  SpO2 99 % 09/13/22 0829  Vitals shown include unvalidated device data.  Last Pain:  Vitals:   09/13/22 0640  PainSc: 0-No pain         Complications: No notable events documented.

## 2022-09-25 ENCOUNTER — Encounter (HOSPITAL_COMMUNITY): Payer: Self-pay | Admitting: General Surgery

## 2022-09-25 ENCOUNTER — Ambulatory Visit: Payer: 59 | Admitting: Family Medicine

## 2022-10-07 ENCOUNTER — Other Ambulatory Visit: Payer: Self-pay | Admitting: Family Medicine

## 2022-10-07 DIAGNOSIS — I10 Essential (primary) hypertension: Secondary | ICD-10-CM

## 2022-10-18 ENCOUNTER — Ambulatory Visit: Payer: 59 | Admitting: General Surgery

## 2022-11-04 ENCOUNTER — Other Ambulatory Visit: Payer: Self-pay | Admitting: Family Medicine

## 2022-11-04 DIAGNOSIS — I10 Essential (primary) hypertension: Secondary | ICD-10-CM

## 2022-11-05 ENCOUNTER — Encounter: Payer: Self-pay | Admitting: Family Medicine

## 2022-11-05 NOTE — Telephone Encounter (Signed)
No answer and no voice mail. Will mail letter 

## 2022-11-05 NOTE — Telephone Encounter (Signed)
Stacks pt NTBS 30-d given 09/10/22

## 2022-12-06 ENCOUNTER — Ambulatory Visit: Payer: 59 | Admitting: Family Medicine

## 2022-12-17 ENCOUNTER — Other Ambulatory Visit: Payer: Self-pay | Admitting: Family Medicine

## 2022-12-17 DIAGNOSIS — Z30019 Encounter for initial prescription of contraceptives, unspecified: Secondary | ICD-10-CM

## 2022-12-18 ENCOUNTER — Telehealth: Payer: Self-pay | Admitting: Family Medicine

## 2022-12-18 ENCOUNTER — Other Ambulatory Visit: Payer: Self-pay | Admitting: *Deleted

## 2022-12-18 DIAGNOSIS — I10 Essential (primary) hypertension: Secondary | ICD-10-CM

## 2022-12-18 MED ORDER — HYDROCHLOROTHIAZIDE 25 MG PO TABS: 25.0000 mg | ORAL_TABLET | Freq: Every day | ORAL | 0 refills | Status: DC

## 2022-12-18 MED ORDER — AMLODIPINE BESYLATE 5 MG PO TABS: 5.0000 mg | ORAL_TABLET | Freq: Every day | ORAL | 0 refills | Status: DC

## 2022-12-18 NOTE — Telephone Encounter (Signed)
  Prescription Request  12/18/2022  Is this a "Controlled Substance" medicine? NO  Have you seen your PCP in the last 2 weeks? No pt has appt on 01/21/23 is about to be out of meds  If YES, route message to pool  -  If NO, patient needs to be scheduled for appointment.  What is the name of the medication or equipment? amLODipine (NORVASC) 5 MG tablet  hydrochlorothiazide (HYDRODIURIL) 25 MG tablet   Have you contacted your pharmacy to request a refill? yes   Which pharmacy would you like this sent to? [facility]    Patient notified that their request is being sent to the clinical staff for review and that they should receive a response within 2 business days.

## 2022-12-18 NOTE — Telephone Encounter (Signed)
One-month refills provided.

## 2023-01-15 ENCOUNTER — Other Ambulatory Visit: Payer: Self-pay | Admitting: Family Medicine

## 2023-01-15 DIAGNOSIS — I10 Essential (primary) hypertension: Secondary | ICD-10-CM

## 2023-01-21 ENCOUNTER — Ambulatory Visit: Payer: 59 | Admitting: Family Medicine

## 2023-01-21 ENCOUNTER — Encounter: Payer: Self-pay | Admitting: Family Medicine

## 2023-01-21 VITALS — BP 118/73 | HR 84 | Temp 98.4°F | Ht 67.0 in | Wt 198.0 lb

## 2023-01-21 DIAGNOSIS — I10 Essential (primary) hypertension: Secondary | ICD-10-CM | POA: Diagnosis not present

## 2023-01-21 DIAGNOSIS — K219 Gastro-esophageal reflux disease without esophagitis: Secondary | ICD-10-CM

## 2023-01-21 DIAGNOSIS — Z13220 Encounter for screening for lipoid disorders: Secondary | ICD-10-CM | POA: Diagnosis not present

## 2023-01-21 DIAGNOSIS — Z30019 Encounter for initial prescription of contraceptives, unspecified: Secondary | ICD-10-CM | POA: Diagnosis not present

## 2023-01-21 MED ORDER — OMEPRAZOLE 20 MG PO CPDR: 20.0000 mg | DELAYED_RELEASE_CAPSULE | Freq: Every day | ORAL | 1 refills | Status: DC

## 2023-01-21 MED ORDER — AMLODIPINE BESYLATE 5 MG PO TABS: 5.0000 mg | ORAL_TABLET | Freq: Every day | ORAL | Status: DC

## 2023-01-21 MED ORDER — MEDROXYPROGESTERONE ACETATE 150 MG/ML IM SUSY: PREFILLED_SYRINGE | INTRAMUSCULAR | 3 refills | Status: DC

## 2023-01-21 MED ORDER — POTASSIUM CHLORIDE CRYS ER 20 MEQ PO TBCR: EXTENDED_RELEASE_TABLET | ORAL | 3 refills | Status: DC

## 2023-01-21 MED ORDER — HYDROCHLOROTHIAZIDE 25 MG PO TABS: 25.0000 mg | ORAL_TABLET | Freq: Every day | ORAL | 1 refills | Status: DC

## 2023-01-21 NOTE — Progress Notes (Signed)
Subjective:  Patient ID: Kathleen Perez, female    DOB: [birth year]  Age: 47 y.o. MRN: [id number]  CC: Medical Management of Chronic Issues   HPI ONDINE FRANCIS presents for  follow-up of hypertension. Patient has no history of headache chest pain or shortness of breath or recent cough. Patient also denies symptoms of TIA such as focal numbness or weakness. Patient denies side effects from medication. States taking it regularly.   History Jara has a past medical history of GERD (gastroesophageal reflux disease), History of kidney stones, Hypertension, Kidney stone, MRSA (methicillin resistant staph aureus) culture positive, Scoliosis, and Urinary tract infection.   She has a past surgical history that includes Kidney stone surgery; Cystectomy; and Umbilical hernia repair (N/A, 09/13/2022).   Her family history is not on file.She reports that she has quit smoking. Her smoking use included cigarettes and e-cigarettes. She uses smokeless tobacco. She reports that she does not drink alcohol and does not use drugs.  No current outpatient medications on file prior to visit.   No current facility-administered medications on file prior to visit.    ROS Review of Systems  Constitutional: Negative.   HENT: Negative.    Eyes:  Negative for visual disturbance.  Respiratory:  Negative for shortness of breath.   Cardiovascular:  Negative for chest pain.  Gastrointestinal:  Negative for abdominal pain.  Musculoskeletal:  Negative for arthralgias.    Objective:  BP 118/73   Pulse 84   Temp 98.4 F (36.9 C)   Ht 5\' 7"  (1.702 m)   Wt 198 lb (89.8 kg)   SpO2 96%   BMI 31.01 kg/m   BP Readings from Last 3 Encounters:  01/21/23 118/73  09/13/22 (!) 140/95  09/11/22 (!) 165/87    Wt Readings from Last 3 Encounters:  01/21/23 198 lb (89.8 kg)  09/11/22 207 lb 0.2 oz (93.9 kg)  08/02/22 207 lb (93.9 kg)     Physical Exam Constitutional:      General: She is not in acute  distress.    Appearance: She is well-developed.  HENT:     Head: Normocephalic and atraumatic.  Eyes:     Conjunctiva/sclera: Conjunctivae normal.     Pupils: Pupils are equal, round, and reactive to light.  Neck:     Thyroid: No thyromegaly.  Cardiovascular:     Rate and Rhythm: Normal rate and regular rhythm.     Heart sounds: Normal heart sounds. No murmur heard. Pulmonary:     Effort: Pulmonary effort is normal. No respiratory distress.     Breath sounds: Normal breath sounds. No wheezing or rales.  Abdominal:     General: Bowel sounds are normal. There is no distension.     Palpations: Abdomen is soft.     Tenderness: There is no abdominal tenderness.  Musculoskeletal:        General: Normal range of motion.     Cervical back: Normal range of motion and neck supple.  Lymphadenopathy:     Cervical: No cervical adenopathy.  Skin:    General: Skin is warm and dry.  Neurological:     Mental Status: She is alert and oriented to person, place, and time.  Psychiatric:        Behavior: Behavior normal.        Thought Content: Thought content normal.        Judgment: Judgment normal.       Assessment & Plan:   Alynda was seen today for medical  management of chronic issues.  Diagnoses and all orders for this visit:  Primary hypertension -     CBC with Differential/Platelet -     CMP14+EGFR -     amLODipine (NORVASC) 5 MG tablet; Take 1 tablet (5 mg total) by mouth daily. (NEEDS TO BE SEEN BEFORE NEXT REFILL) -     hydrochlorothiazide (HYDRODIURIL) 25 MG tablet; Take 1 tablet (25 mg total) by mouth daily. (NEEDS TO BE SEEN BEFORE NEXT REFILL)  Lipid screening -     Lipid panel  Encounter for female birth control -     medroxyPROGESTERone Acetate 150 MG/ML SUSY; INJECT 1 ML INTO THE MUSCLE EVERY 3 MONTHS  Gastroesophageal reflux disease without esophagitis -     omeprazole (PRILOSEC) 20 MG capsule; Take 1 capsule (20 mg total) by mouth daily.  Other orders -      potassium chloride SA (KLOR-CON M20) 20 MEQ tablet; TAKE 1  BY MOUTH ONCE DAILY FOR POTASSIUM   Allergies as of 01/21/2023   No Known Allergies      Medication List        Accurate as of January 21, 2023 10:58 AM. If you have any questions, ask your nurse or doctor.          STOP taking these medications    betamethasone dipropionate 0.05 % cream Stopped by: Mayco Walrond   ondansetron 4 MG disintegrating tablet Commonly known as: ZOFRAN-ODT Stopped by: Babak Lucus   ondansetron 4 MG tablet Commonly known as: Zofran Stopped by: Meryem Haertel   oxyCODONE 5 MG immediate release tablet Commonly known as: Roxicodone Stopped by: Kylo Gavin       TAKE these medications    amLODipine 5 MG tablet Commonly known as: NORVASC Take 1 tablet (5 mg total) by mouth daily. (NEEDS TO BE SEEN BEFORE NEXT REFILL)   hydrochlorothiazide 25 MG tablet Commonly known as: HYDRODIURIL Take 1 tablet (25 mg total) by mouth daily. (NEEDS TO BE SEEN BEFORE NEXT REFILL)   medroxyPROGESTERone Acetate 150 MG/ML Susy INJECT 1 ML INTO THE MUSCLE EVERY 3 MONTHS   omeprazole 20 MG capsule Commonly known as: PRILOSEC Take 1 capsule (20 mg total) by mouth daily.   potassium chloride SA 20 MEQ tablet Commonly known as: Klor-Con M20 TAKE 1  BY MOUTH ONCE DAILY FOR POTASSIUM        Meds ordered this encounter  Medications   amLODipine (NORVASC) 5 MG tablet    Sig: Take 1 tablet (5 mg total) by mouth daily. (NEEDS TO BE SEEN BEFORE NEXT REFILL)    Dispense:  90 tablet    Refill:  01   hydrochlorothiazide (HYDRODIURIL) 25 MG tablet    Sig: Take 1 tablet (25 mg total) by mouth daily. (NEEDS TO BE SEEN BEFORE NEXT REFILL)    Dispense:  90 tablet    Refill:  1   medroxyPROGESTERone Acetate 150 MG/ML SUSY    Sig: INJECT 1 ML INTO THE MUSCLE EVERY 3 MONTHS    Dispense:  1 mL    Refill:  3   omeprazole (PRILOSEC) 20 MG capsule    Sig: Take 1 capsule (20 mg total) by mouth daily.     Dispense:  90 capsule    Refill:  1   potassium chloride SA (KLOR-CON M20) 20 MEQ tablet    Sig: TAKE 1  BY MOUTH ONCE DAILY FOR POTASSIUM    Dispense:  90 tablet    Refill:  3  Follow-up: Return in about 6 months (around 07/24/2023).  Mechele Claude, M.D.

## 2023-01-23 ENCOUNTER — Other Ambulatory Visit: Payer: Self-pay | Admitting: Family Medicine

## 2023-01-23 MED ORDER — ATORVASTATIN CALCIUM 40 MG PO TABS: 40.0000 mg | ORAL_TABLET | Freq: Every day | ORAL | 3 refills | Status: AC

## 2023-04-12 ENCOUNTER — Ambulatory Visit (INDEPENDENT_AMBULATORY_CARE_PROVIDER_SITE_OTHER): Payer: 59 | Admitting: Family Medicine

## 2023-04-12 ENCOUNTER — Encounter: Payer: Self-pay | Admitting: Family Medicine

## 2023-04-12 VITALS — BP 143/87 | HR 88 | Temp 98.1°F | Ht 67.0 in | Wt 199.6 lb

## 2023-04-12 DIAGNOSIS — L0291 Cutaneous abscess, unspecified: Secondary | ICD-10-CM

## 2023-04-12 MED ORDER — DOXYCYCLINE HYCLATE 100 MG PO TABS: 100.0000 mg | ORAL_TABLET | Freq: Two times a day (BID) | ORAL | 0 refills | Status: AC

## 2023-04-12 NOTE — Progress Notes (Signed)
Subjective:  Patient ID: Kathleen Perez, female    DOB: 10-Jun-1976, 47 y.o.   MRN: [id number]  Patient Care Team: Mechele Claude, MD as PCP - General (Family Medicine)   Chief Complaint:  Recurrent Skin Infections (Patient has boil in her groin area x 3 days. Also has one on the back of her neck. )   HPI: Kathleen Perez is a 48 y.o. female presenting on 04/12/2023 for Recurrent Skin Infections (Patient has boil in her groin area x 3 days. Also has one on the back of her neck. )   Discussed the use of AI scribe software for clinical note transcription with the patient, who gave verbal consent to proceed.  History of Present Illness   The patient, Kathleen Perez, presented with a recurrent skin issue that began approximately a week ago. Initially, she noticed a boil in her genital area, which has since healed. However, after shaving recently, she noticed another boil in the same area. Concurrently, she also developed what she believes to be a sinus infection, a condition she experiences annually.  In the middle of the night, she noticed a similar boil on her face. She mentioned that her husband was recently treated for a skin infection that caused boils in a localized area. She was unsure if her current condition was related to her husband's infection or due to ingrown hairs from shaving.  In addition to the boils in the genital area and on the face, she also had another boil on her leg, which is currently healing. She has been managing these boils without any specific treatment until this consultation.        Relevant past medical, surgical, family, and social history reviewed and updated as indicated.  Allergies and medications reviewed and updated. Data reviewed: Chart in Epic.   Past Medical History:  Diagnosis Date   GERD (gastroesophageal reflux disease)    History of kidney stones    Hypertension    Kidney stone    MRSA (methicillin resistant staph aureus) culture positive     Scoliosis    Urinary tract infection     Past Surgical History:  Procedure Laterality Date   CYSTECTOMY     KIDNEY STONE SURGERY     UMBILICAL HERNIA REPAIR N/A 09/13/2022   Procedure: HERNIA REPAIR UMBILICAL ADULT W/ MESH;  Surgeon: Lucretia Roers, MD;  Location: [facility];  Service: General;  Laterality: N/A;    Social History   Socioeconomic History   Marital status: Married    Spouse name: Not on file   Number of children: Not on file   Years of education: Not on file   Highest education level: Not on file  Occupational History   Not on file  Tobacco Use   Smoking status: Former    Current packs/day: 1.50    Types: Cigarettes, E-cigarettes   Smokeless tobacco: Current  Vaping Use   Vaping status: Every Day  Substance and Sexual Activity   Alcohol use: No   Drug use: No   Sexual activity: Yes    Birth control/protection: Condom  Other Topics Concern   Not on file  Social History Narrative   Not on file   Social Determinants of Health   Financial Resource Strain: Not on file  Food Insecurity: Not on file  Transportation Needs: Not on file  Physical Activity: Not on file  Stress: Not on file  Social Connections: Not on file  Intimate Partner Violence: Not on file  Outpatient Encounter Medications as of 04/12/2023  Medication Sig   amLODipine (NORVASC) 5 MG tablet Take 1 tablet (5 mg total) by mouth daily. (NEEDS TO BE SEEN BEFORE NEXT REFILL)   atorvastatin (LIPITOR) 40 MG tablet Take 1 tablet (40 mg total) by mouth daily. For cholesterol   doxycycline (VIBRA-TABS) 100 MG tablet Take 1 tablet (100 mg total) by mouth 2 (two) times daily for 10 days. 1 po bid   hydrochlorothiazide (HYDRODIURIL) 25 MG tablet Take 1 tablet (25 mg total) by mouth daily. (NEEDS TO BE SEEN BEFORE NEXT REFILL)   medroxyPROGESTERone Acetate 150 MG/ML SUSY INJECT 1 ML INTO THE MUSCLE EVERY 3 MONTHS   omeprazole (PRILOSEC) 20 MG capsule Take 1 capsule (20 mg total) by mouth daily.    potassium chloride SA (KLOR-CON M20) 20 MEQ tablet TAKE 1  BY MOUTH ONCE DAILY FOR POTASSIUM   No facility-administered encounter medications on file as of 04/12/2023.    No Known Allergies  Pertinent ROS per HPI, otherwise unremarkable      Objective:  BP (!) 143/87   Pulse 88   Temp 98.1 F (36.7 C) (Temporal)   Ht 5\' 7"  (1.702 m)   Wt 199 lb 9.6 oz (90.5 kg)   SpO2 95%   BMI 31.26 kg/m    Wt Readings from Last 3 Encounters:  04/12/23 199 lb 9.6 oz (90.5 kg)  01/21/23 198 lb (89.8 kg)  09/11/22 207 lb 0.2 oz (93.9 kg)    Physical Exam Vitals and nursing note reviewed.  Constitutional:      General: She is not in acute distress.    Appearance: Normal appearance. She is obese. She is not ill-appearing, toxic-appearing or diaphoretic.  HENT:     Head: Normocephalic and atraumatic.     Nose: Congestion present.     Mouth/Throat:     Mouth: Mucous membranes are moist.  Eyes:     Conjunctiva/sclera: Conjunctivae normal.     Pupils: Pupils are equal, round, and reactive to light.  Cardiovascular:     Rate and Rhythm: Normal rate and regular rhythm.     Heart sounds: Normal heart sounds.  Pulmonary:     Effort: Pulmonary effort is normal.     Breath sounds: Normal breath sounds.  Musculoskeletal:     Cervical back: Normal range of motion and neck supple.  Skin:    General: Skin is warm and dry.     Capillary Refill: Capillary refill takes less than 2 seconds.     Findings: Abscess (left lateral neck, right labia, left lower abdomen) present.  Neurological:     General: No focal deficit present.     Mental Status: She is alert and oriented to person, place, and time.  Psychiatric:        Mood and Affect: Mood normal.        Behavior: Behavior normal.        Thought Content: Thought content normal.        Judgment: Judgment normal.     Results for orders placed or performed in visit on 01/21/23  CBC with Differential/Platelet  Result Value Ref Range   WBC  11.7 (H) 3.4 - 10.8 x10E3/uL   RBC 5.15 3.77 - 5.28 x10E6/uL   Hemoglobin 14.8 11.1 - 15.9 g/dL   Hematocrit 27.2 53.6 - 46.6 %   MCV 87 79 - 97 fL   MCH 28.7 26.6 - 33.0 pg   MCHC 33.1 31.5 - 35.7 g/dL   RDW 13.1  11.7 - 15.4 %   Platelets 372 150 - 450 x10E3/uL   Neutrophils 64 Not Estab. %   Lymphs 25 Not Estab. %   Monocytes 7 Not Estab. %   Eos 3 Not Estab. %   Basos 1 Not Estab. %   Neutrophils Absolute 7.4 (H) 1.4 - 7.0 x10E3/uL   Lymphocytes Absolute 2.9 0.7 - 3.1 x10E3/uL   Monocytes Absolute 0.9 0.1 - 0.9 x10E3/uL   EOS (ABSOLUTE) 0.3 0.0 - 0.4 x10E3/uL   Basophils Absolute 0.1 0.0 - 0.2 x10E3/uL   Immature Granulocytes 0 Not Estab. %   Immature Grans (Abs) 0.0 0.0 - 0.1 x10E3/uL  CMP14+EGFR  Result Value Ref Range   Glucose 100 (H) 70 - 99 mg/dL   BUN 15 6 - 24 mg/dL   Creatinine, Ser 4.01 0.57 - 1.00 mg/dL   eGFR 027 >25 DG/UYQ/0.34   BUN/Creatinine Ratio 23 9 - 23   Sodium 144 134 - 144 mmol/L   Potassium 3.9 3.5 - 5.2 mmol/L   Chloride 101 96 - 106 mmol/L   CO2 26 20 - 29 mmol/L   Calcium 10.1 8.7 - 10.2 mg/dL   Total Protein 7.1 6.0 - 8.5 g/dL   Albumin 4.3 3.9 - 4.9 g/dL   Globulin, Total 2.8 1.5 - 4.5 g/dL   Bilirubin Total 0.2 0.0 - 1.2 mg/dL   Alkaline Phosphatase 95 44 - 121 IU/L   AST 25 0 - 40 IU/L   ALT 34 (H) 0 - 32 IU/L  Lipid panel  Result Value Ref Range   Cholesterol, Total 192 100 - 199 mg/dL   Triglycerides 742 (H) 0 - 149 mg/dL   HDL 30 (L) >59 mg/dL   VLDL Cholesterol Cal 74 (H) 5 - 40 mg/dL   LDL Chol Calc (NIH) 88 0 - 99 mg/dL   Chol/HDL Ratio 6.4 (H) 0.0 - 4.4 ratio       Pertinent labs & imaging results that were available during my care of the patient were reviewed by me and considered in my medical decision making.  Assessment & Plan:  Donnamaria was seen today for recurrent skin infections.  Diagnoses and all orders for this visit:  Abscess -     doxycycline (VIBRA-TABS) 100 MG tablet; Take 1 tablet (100 mg total) by mouth 2  (two) times daily for 10 days. 1 po bid     Assessment and Plan    Skin Infections Multiple boils in various locations, likely due to colonization of bacteria on the skin and subsequent infection. Recent history of similar infection in the patient's husband. -Start Doxycycline 100mg  twice daily for 10 days. -Use Hibiclens or similar antimicrobial wash before and after shaving. -Contact the office if boils worsen or do not resolve.  Sinus Infection Self-reported annual sinus infections. -Doxycycline 100mg  twice daily for 10 days should also treat the sinus infection.  Follow-up If symptoms worsen or new symptoms arise, patient should contact the office.          Continue all other maintenance medications.  Follow up plan: Return if symptoms worsen or fail to improve.   Continue healthy lifestyle choices, including diet (rich in fruits, vegetables, and lean proteins, and low in salt and simple carbohydrates) and exercise (at least 30 minutes of moderate physical activity daily).  Educational handout given for abscess  The above assessment and management plan was discussed with the patient. The patient verbalized understanding of and has agreed to the management plan. Patient is aware to  call the clinic if they develop any new symptoms or if symptoms persist or worsen. Patient is aware when to return to the clinic for a follow-up visit. Patient educated on when it is appropriate to go to the emergency department.   Kari Baars, FNP-C [facility] [phone]

## 2023-07-03 ENCOUNTER — Emergency Department (HOSPITAL_COMMUNITY)
Admission: EM | Admit: 2023-07-03 | Discharge: 2023-07-04 | Disposition: A | Discharge status: Home / Self Care | Payer: 59 | Attending: Emergency Medicine | Admitting: Emergency Medicine

## 2023-07-03 ENCOUNTER — Encounter (HOSPITAL_COMMUNITY): Payer: Self-pay

## 2023-07-03 ENCOUNTER — Other Ambulatory Visit: Payer: Self-pay

## 2023-07-03 DIAGNOSIS — I160 Hypertensive urgency: Secondary | ICD-10-CM

## 2023-07-03 DIAGNOSIS — R519 Headache, unspecified: Secondary | ICD-10-CM | POA: Diagnosis present

## 2023-07-03 DIAGNOSIS — Z79899 Other long term (current) drug therapy: Secondary | ICD-10-CM | POA: Insufficient documentation

## 2023-07-03 DIAGNOSIS — R030 Elevated blood-pressure reading, without diagnosis of hypertension: Secondary | ICD-10-CM | POA: Diagnosis not present

## 2023-07-03 DIAGNOSIS — I10 Essential (primary) hypertension: Secondary | ICD-10-CM | POA: Insufficient documentation

## 2023-07-03 NOTE — ED Triage Notes (Addendum)
 Pov from home. Cc of hypertension. Hx of the same but this time couldn't get her meds to get it down. 227/120 at home 198/120 in triage C/o slight headache.

## 2023-07-04 ENCOUNTER — Emergency Department (HOSPITAL_COMMUNITY): Payer: 59

## 2023-07-04 LAB — CBC WITH DIFFERENTIAL/PLATELET
Abs Immature Granulocytes: 0.05 10*3/uL (ref 0.00–0.07)
Basophils Absolute: 0.1 10*3/uL (ref 0.0–0.1)
Basophils Relative: 1 %
Eosinophils Absolute: 0.3 10*3/uL (ref 0.0–0.5)
Eosinophils Relative: 2 %
HCT: 44.4 % (ref 36.0–46.0)
Hemoglobin: 15.1 g/dL — ABNORMAL HIGH (ref 12.0–15.0)
Immature Granulocytes: 0 %
Lymphocytes Relative: 30 %
Lymphs Abs: 3.5 10*3/uL (ref 0.7–4.0)
MCH: 29 pg (ref 26.0–34.0)
MCHC: 34 g/dL (ref 30.0–36.0)
MCV: 85.2 fL (ref 80.0–100.0)
Monocytes Absolute: 0.8 10*3/uL (ref 0.1–1.0)
Monocytes Relative: 7 %
Neutro Abs: 7 10*3/uL (ref 1.7–7.7)
Neutrophils Relative %: 60 %
Platelets: 335 10*3/uL (ref 150–400)
RBC: 5.21 MIL/uL — ABNORMAL HIGH (ref 3.87–5.11)
RDW: 12.8 % (ref 11.5–15.5)
WBC: 11.7 10*3/uL — ABNORMAL HIGH (ref 4.0–10.5)
nRBC: 0 % (ref 0.0–0.2)

## 2023-07-04 LAB — BASIC METABOLIC PANEL
Anion gap: 11 (ref 5–15)
BUN: 15 mg/dL (ref 6–20)
CO2: 24 mmol/L (ref 22–32)
Calcium: 9.8 mg/dL (ref 8.9–10.3)
Chloride: 100 mmol/L (ref 98–111)
Creatinine, Ser: 0.7 mg/dL (ref 0.44–1.00)
GFR, Estimated: >60 mL/min (ref >60)
Glucose, Bld: 119 mg/dL — ABNORMAL HIGH (ref 70–99)
Potassium: 3.5 mmol/L (ref 3.5–5.1)
Sodium: 135 mmol/L (ref 135–145)

## 2023-07-04 MED ORDER — KETOROLAC TROMETHAMINE 30 MG/ML IJ SOLN
30.0000 mg | Freq: Once | INTRAMUSCULAR | Status: AC
  Administered 2023-07-04: 30 mg via INTRAVENOUS
  Filled 2023-07-04: qty 1

## 2023-07-04 MED ORDER — METOCLOPRAMIDE HCL 5 MG/ML IJ SOLN
10.0000 mg | Freq: Once | INTRAMUSCULAR | Status: AC
  Administered 2023-07-04: 10 mg via INTRAVENOUS
  Filled 2023-07-04: qty 2

## 2023-07-04 MED ORDER — SODIUM CHLORIDE 0.9 % IV BOLUS
1000.0000 mL | Freq: Once | INTRAVENOUS | Status: AC
  Administered 2023-07-04: 1000 mL via INTRAVENOUS

## 2023-07-04 MED ORDER — CLONIDINE HCL 0.1 MG PO TABS
0.1000 mg | ORAL_TABLET | Freq: Once | ORAL | Status: AC
  Administered 2023-07-04: 0.1 mg via ORAL
  Filled 2023-07-04: qty 1

## 2023-07-04 NOTE — ED Notes (Signed)
 Patient transported to CT

## 2023-07-04 NOTE — ED Provider Notes (Signed)
 [place] EMERGENCY DEPARTMENT AT [facility] Provider Note   CSN: [id number] Arrival date & time: 07/03/23  2150     History  Chief Complaint  Patient presents with   Hypertension    Kathleen Perez is a 48 y.o. female.  Patient is a 48 year old female with past medical history of hypertension and hyperlipidemia.  Patient presenting today with complaints of headache and elevated blood pressure.  The headache started yesterday morning and has progressed throughout the day.  She checked her blood pressure and was getting readings over 200 systolic despite taking her amlodipine  and hydrochlorothiazide .  She denies visual disturbances, weakness, or numbness.  No nausea or vomiting.  No fevers or chills.  The history is provided by the patient.       Home Medications Prior to Admission medications   Medication Sig Start Date End Date Taking? Authorizing Provider  amLODipine  (NORVASC ) 5 MG tablet Take 1 tablet (5 mg total) by mouth daily. (NEEDS TO BE SEEN BEFORE NEXT REFILL) 01/21/23   Zollie Lowers, MD  atorvastatin  (LIPITOR) 40 MG tablet Take 1 tablet (40 mg total) by mouth daily. For cholesterol 01/23/23   Zollie Lowers, MD  hydrochlorothiazide  (HYDRODIURIL ) 25 MG tablet Take 1 tablet (25 mg total) by mouth daily. (NEEDS TO BE SEEN BEFORE NEXT REFILL) 01/21/23   Zollie Lowers, MD  medroxyPROGESTERone  Acetate 150 MG/ML SUSY INJECT 1 ML INTO THE MUSCLE EVERY 3 MONTHS 01/21/23   Zollie Lowers, MD  omeprazole  (PRILOSEC) 20 MG capsule Take 1 capsule (20 mg total) by mouth daily. 01/21/23   Zollie Lowers, MD  potassium chloride  SA (KLOR-CON  M20) 20 MEQ tablet TAKE 1  BY MOUTH ONCE DAILY FOR POTASSIUM 01/21/23   Zollie Lowers, MD      Allergies    Patient has no known allergies.    Review of Systems   Review of Systems  All other systems reviewed and are negative.   Physical Exam Updated Vital Signs BP (!) 171/105   Pulse 91   Temp 98.3 F (36.8 C) (Oral)   Resp 17    Ht 5' 6 (1.676 m)   Wt 95.3 kg   SpO2 96%   BMI 33.89 kg/m  Physical Exam Vitals and nursing note reviewed.  Constitutional:      General: She is not in acute distress.    Appearance: She is well-developed. She is not diaphoretic.  HENT:     Head: Normocephalic and atraumatic.  Eyes:     Extraocular Movements: Extraocular movements intact.     Pupils: Pupils are equal, round, and reactive to light.  Cardiovascular:     Rate and Rhythm: Normal rate and regular rhythm.     Heart sounds: No murmur heard.    No friction rub. No gallop.  Pulmonary:     Effort: Pulmonary effort is normal. No respiratory distress.     Breath sounds: Normal breath sounds. No wheezing.  Abdominal:     General: Bowel sounds are normal. There is no distension.     Palpations: Abdomen is soft.     Tenderness: There is no abdominal tenderness.  Musculoskeletal:        General: Normal range of motion.     Cervical back: Normal range of motion and neck supple.  Skin:    General: Skin is warm and dry.  Neurological:     General: No focal deficit present.     Mental Status: She is alert and oriented to person, place, and time.  Cranial Nerves: No cranial nerve deficit.     Motor: No weakness.     Coordination: Coordination normal.     ED Results / Procedures / Treatments   Labs (all labs ordered are listed, but only abnormal results are displayed) Labs Reviewed - No data to display  EKG None  Radiology No results found.  Procedures Procedures    Medications Ordered in ED Medications  sodium chloride  0.9 % bolus 1,000 mL (has no administration in time range)  ketorolac  (TORADOL ) 30 MG/ML injection 30 mg (has no administration in time range)  metoCLOPramide  (REGLAN ) injection 10 mg (has no administration in time range)  cloNIDine  (CATAPRES ) tablet 0.1 mg (has no administration in time range)    ED Course/ Medical Decision Making/ A&P  Patient is a 48 year old female presenting with  elevated blood pressure and headache as described in the HPI.  Patient arrives with stable vital signs and is afebrile.  Initial blood pressure 198/120.  Physical examination is unremarkable and patient is neurologically intact.  Laboratory studies obtained including CBC and basic metabolic panel, both of which are unremarkable.  Head CT obtained showing no acute process.  Patient has received an oral dose of clonidine  and blood pressure is now 134/69.  She was also given Toradol  and Reglan  for her headache and seems to be feeling markedly improved.  Patient to be discharged with close monitoring of her blood pressure and follow-up with primary doctor.  Final Clinical Impression(s) / ED Diagnoses Final diagnoses:  None    Rx / DC Orders ED Discharge Orders     None         Geroldine Berg, MD 07/04/23 [phone]

## 2023-07-04 NOTE — Discharge Instructions (Signed)
 Continue medications as previously prescribed.  Take Tylenol 1000 mg rotated with ibuprofen 600 mg every 4 hours as needed for pain.  Keep a record of your blood pressures at home and take this with you to your next doctor's appointment.

## 2023-08-08 ENCOUNTER — Ambulatory Visit (INDEPENDENT_AMBULATORY_CARE_PROVIDER_SITE_OTHER): Payer: 59 | Admitting: Family Medicine

## 2023-08-08 ENCOUNTER — Encounter: Payer: Self-pay | Admitting: Family Medicine

## 2023-08-08 VITALS — BP 129/69 | HR 89 | Temp 99.7°F | Ht 66.0 in | Wt 211.6 lb

## 2023-08-08 DIAGNOSIS — R509 Fever, unspecified: Secondary | ICD-10-CM | POA: Diagnosis not present

## 2023-08-08 DIAGNOSIS — Z20828 Contact with and (suspected) exposure to other viral communicable diseases: Secondary | ICD-10-CM

## 2023-08-08 DIAGNOSIS — B349 Viral infection, unspecified: Secondary | ICD-10-CM | POA: Diagnosis not present

## 2023-08-08 LAB — VERITOR FLU A/B WAIVED
Influenza A: NEGATIVE
Influenza B: NEGATIVE

## 2023-08-08 NOTE — Progress Notes (Signed)
Acute Office Visit  Subjective:     Patient ID: Kathleen Perez, female    DOB: 01/18/1976, 48 y.o.   MRN: [id number]  Chief Complaint  Patient presents with   Chills   Generalized Body Aches    URI  This is a new problem. The current episode started today. The maximum temperature recorded prior to her arrival was 100.4 - 100.9 F. The fever has been present for Less than 1 day. Associated symptoms include congestion, coughing, headaches, joint pain and sinus pain. Pertinent negatives include no abdominal pain, chest pain, diarrhea, dysuria, ear pain, joint swelling, nausea, neck pain, sore throat, vomiting or wheezing. She has tried acetaminophen for the symptoms. The treatment provided mild relief.   Numerous co-workers out with flu right now.   Review of Systems  HENT:  Positive for congestion and sinus pain. Negative for ear pain and sore throat.   Respiratory:  Positive for cough. Negative for wheezing.   Cardiovascular:  Negative for chest pain.  Gastrointestinal:  Negative for abdominal pain, diarrhea, nausea and vomiting.  Genitourinary:  Negative for dysuria.  Musculoskeletal:  Positive for joint pain. Negative for neck pain.  Neurological:  Positive for headaches.        Objective:    BP 129/69   Pulse 89   Temp 99.7 F (37.6 C) (Oral)   Ht 5\' 6"  (1.676 m)   Wt 211 lb 9.6 oz (96 kg)   SpO2 97%   BMI 34.15 kg/m    Physical Exam Vitals and nursing note reviewed.  Constitutional:      General: She is not in acute distress.    Appearance: She is ill-appearing. She is not toxic-appearing or diaphoretic.  HENT:     Right Ear: Tympanic membrane, ear canal and external ear normal.     Left Ear: Tympanic membrane, ear canal and external ear normal.     Nose: Congestion present.     Mouth/Throat:     Mouth: Mucous membranes are moist.     Pharynx: Oropharynx is clear. Posterior oropharyngeal erythema present. No pharyngeal swelling or oropharyngeal exudate.      Tonsils: No tonsillar exudate or tonsillar abscesses. 1+ on the right. 1+ on the left.  Eyes:     General:        Right eye: No discharge.        Left eye: No discharge.     Conjunctiva/sclera: Conjunctivae normal.  Cardiovascular:     Rate and Rhythm: Normal rate and regular rhythm.     Heart sounds: Normal heart sounds. No murmur heard. Pulmonary:     Effort: Pulmonary effort is normal. No respiratory distress.     Breath sounds: Normal breath sounds. No wheezing, rhonchi or rales.  Musculoskeletal:     Cervical back: Neck supple. No rigidity.  Neurological:     General: No focal deficit present.     Mental Status: She is alert and oriented to person, place, and time.  Psychiatric:        Mood and Affect: Mood normal.        Behavior: Behavior normal.     No results found for any visits on 08/08/23.      Assessment & Plan:   Kathleen Perez was seen today for chills and generalized body aches.  Diagnoses and all orders for this visit:  Viral illness -     Veritor Flu A/B Waived  Fever, unspecified fever cause -     Veritor Flu A/B  Waived  Exposure to the flu -     Veritor Flu A/B Waived   Negative rapid flu today. Declined PCR for flu, Covid, RSV. Discussed symptomatic care and return precautions.   The patient indicates understanding of these issues and agrees with the plan.  Gabriel Earing, FNP

## 2023-08-08 NOTE — Patient Instructions (Signed)

## 2023-10-29 ENCOUNTER — Ambulatory Visit: Payer: Self-pay | Admitting: Family Medicine

## 2023-10-29 NOTE — Telephone Encounter (Signed)
 Chief Complaint: Boil on right side of buttocks x2 days  Symptoms: Intermittent mild pain (3/10), "feels icky," "slight fever" per pt 99 F last night Pertinent Negatives: Patient denies shaking chills,  rash  Disposition:  [x] Appointment (In office)  Additional Notes: Patient states she has had this before. Pt is not sure if the boil has a head. Pt states it is about the size of a quarter. Pt scheduled for an appointment tomorrow afternoon with PCP in office. This RN educated pt on home care, new-worsening symptoms, when to call back/seek emergent care. Pt verbalized understanding and agrees to plan.    Copied from CRM [id number]. Topic: Clinical - Red Word Triage >> Oct 29, 2023 10:03 AM Elle L wrote: Red Word that prompted transfer to Nurse Triage: The patient thinks she may have cellulitis. She states she has a boil, swelling, and redness on her bottom that is uncomfortable. Reason for Disposition  Boil > 1/2 inch across (> 12 mm; larger than a marble)  Answer Assessment - Initial Assessment Questions Chief Complaint: Boil on right side of buttocks x2 days  Symptoms: Intermittent mild pain (3/10), "feels icky," "slight fever" per pt 99 F last night Pertinent Negatives: Patient denies shaking chills,  rash  SOURCE: "Have you been around anyone with boils or other Staph infections?" "Have you ever had boils before?"     Husband, had cellulitis  Protocols used: Boil (Skin Abscess)-A-AH

## 2023-10-30 ENCOUNTER — Encounter: Payer: Self-pay | Admitting: Family Medicine

## 2023-10-30 ENCOUNTER — Ambulatory Visit (INDEPENDENT_AMBULATORY_CARE_PROVIDER_SITE_OTHER): Admitting: Family Medicine

## 2023-10-30 VITALS — BP 135/76 | HR 94 | Temp 98.2°F | Ht 66.0 in | Wt 209.0 lb

## 2023-10-30 DIAGNOSIS — I10 Essential (primary) hypertension: Secondary | ICD-10-CM | POA: Diagnosis not present

## 2023-10-30 DIAGNOSIS — E782 Mixed hyperlipidemia: Secondary | ICD-10-CM

## 2023-10-30 DIAGNOSIS — Z30019 Encounter for initial prescription of contraceptives, unspecified: Secondary | ICD-10-CM

## 2023-10-30 DIAGNOSIS — K219 Gastro-esophageal reflux disease without esophagitis: Secondary | ICD-10-CM

## 2023-10-30 LAB — LIPID PANEL

## 2023-10-30 MED ORDER — PHENTERMINE HCL 37.5 MG PO CAPS: 37.5000 mg | ORAL_CAPSULE | ORAL | 2 refills | Status: AC

## 2023-10-30 MED ORDER — DOXYCYCLINE HYCLATE 100 MG PO CAPS: 100.0000 mg | ORAL_CAPSULE | Freq: Two times a day (BID) | ORAL | 0 refills | Status: DC

## 2023-10-30 MED ORDER — AMLODIPINE BESYLATE 10 MG PO TABS: 10.0000 mg | ORAL_TABLET | Freq: Every day | ORAL | 1 refills | Status: DC

## 2023-10-30 MED ORDER — HYDROCHLOROTHIAZIDE 25 MG PO TABS: 25.0000 mg | ORAL_TABLET | Freq: Every day | ORAL | 1 refills | Status: AC

## 2023-10-30 MED ORDER — MEDROXYPROGESTERONE ACETATE 150 MG/ML IM SUSY: PREFILLED_SYRINGE | INTRAMUSCULAR | 3 refills | Status: AC

## 2023-10-30 MED ORDER — OMEPRAZOLE 20 MG PO CPDR: 20.0000 mg | DELAYED_RELEASE_CAPSULE | Freq: Every day | ORAL | 1 refills | Status: AC

## 2023-10-30 NOTE — Progress Notes (Signed)
 Subjective:  Patient ID: Kathleen Perez, female    DOB: 01-01-1976  Age: 48 y.o. MRN: [id number]  CC: Boil (Boil on vagina. Husband and daughter have had cellulitis boils and was given doxycycline  Boil noticed 3 days ago and has gotten bigger. Some pain. No drainage.) and Obesity (Would like to try a weight loss medication or appetite suppressant.)   HPI Kathleen Perez presents for  presents for  follow-up of hypertension. Patient has no history of headache chest pain or shortness of breath or recent cough. Patient also denies symptoms of TIA such as focal numbness or weakness. Patient denies side effects from medication. States taking it regularly.   in for follow-up of elevated cholesterol. Doing well without complaints on current medication. Denies side effects of statin including myalgia and arthralgia and nausea. Currently no chest pain, shortness of breath or other cardiovascular related symptoms noted.  Patient in for follow-up of GERD. Currently asymptomatic taking  PPI daily. There is no chest pain or heartburn. No hematemesis and no melena. No dysphagia or choking. Onset is remote. Progression is stable. Complicating factors, none.. Not growing.   Boil at vulva present 3 days. A little smaller today. Declines exam.       10/30/2023    3:58 PM 01/21/2023   10:32 AM 01/21/2023   10:22 AM  Depression screen PHQ 2/9  Decreased Interest 3 3 0  Down, Depressed, Hopeless 3 3 0  PHQ - 2 Score 6 6 0  Altered sleeping 3 3   Tired, decreased energy 3 3   Change in appetite 3 0   Feeling bad or failure about yourself  1 0   Trouble concentrating 3 3   Moving slowly or fidgety/restless 1 0   Suicidal thoughts 1 0   PHQ-9 Score 21 15   Difficult doing work/chores Somewhat difficult Somewhat difficult     History Paije has a past medical history of GERD (gastroesophageal reflux disease), History of kidney stones, Hypertension, Kidney stone, MRSA (methicillin resistant staph aureus)  culture positive, Scoliosis, and Urinary tract infection.   She has a past surgical history that includes Kidney stone surgery; Cystectomy; and Umbilical hernia repair (N/A, 09/13/2022).   Her family history is not on file.She reports that she has quit smoking. Her smoking use included cigarettes and e-cigarettes. She uses smokeless tobacco. She reports that she does not drink alcohol and does not use drugs.    ROS Review of Systems  Constitutional: Negative.   HENT: Negative.    Eyes:  Negative for visual disturbance.  Respiratory:  Negative for shortness of breath.   Cardiovascular:  Negative for chest pain.  Gastrointestinal:  Negative for abdominal pain.  Musculoskeletal:  Negative for arthralgias.    Objective:  BP 135/76   Pulse 94   Temp 98.2 F (36.8 C)   Ht 5\' 6"  (1.676 m)   Wt 209 lb (94.8 kg)   SpO2 97%   BMI 33.73 kg/m   BP Readings from Last 3 Encounters:  10/30/23 135/76  08/08/23 129/69  07/04/23 134/69    Wt Readings from Last 3 Encounters:  10/30/23 209 lb (94.8 kg)  08/08/23 211 lb 9.6 oz (96 kg)  07/03/23 210 lb (95.3 kg)     Physical Exam Constitutional:      General: She is not in acute distress.    Appearance: She is well-developed. She is obese.  Cardiovascular:     Rate and Rhythm: Normal rate and regular rhythm.  Pulmonary:  Breath sounds: Normal breath sounds.  Musculoskeletal:        General: Normal range of motion.  Skin:    General: Skin is warm and dry.  Neurological:     Mental Status: She is alert and oriented to person, place, and time.      Assessment & Plan:  Primary hypertension -     CMP14+EGFR -     amLODIPine  Besylate; Take 1 tablet (10 mg total) by mouth daily.  Dispense: 90 tablet; Refill: 1 -     hydroCHLOROthiazide ; Take 1 tablet (25 mg total) by mouth daily.  Dispense: 90 tablet; Refill: 1  Gastroesophageal reflux disease without esophagitis -     CBC with Differential/Platelet -     CMP14+EGFR -      Omeprazole ; Take 1 capsule (20 mg total) by mouth daily.  Dispense: 90 capsule; Refill: 1  Mixed hyperlipidemia -     CMP14+EGFR -     Lipid panel  Encounter for female birth control -     medroxyPROGESTERone  Acetate; INJECT 1 ML INTO THE MUSCLE EVERY 3 MONTHS  Dispense: 1 mL; Refill: 3  Other orders -     Doxycycline  Hyclate; Take 1 capsule (100 mg total) by mouth 2 (two) times daily.  Dispense: 20 capsule; Refill: 0 -     Phentermine HCl; Take 1 capsule (37.5 mg total) by mouth every morning.  Dispense: 30 capsule; Refill: 2     Follow-up: Return in about 3 months (around 01/30/2024).  Kathleen Perez, M.D.

## 2023-10-31 LAB — CMP14+EGFR
ALT: 22 IU/L (ref 0–32)
AST: 18 IU/L (ref 0–40)
Albumin: 4.2 g/dL (ref 3.9–4.9)
Alkaline Phosphatase: 113 IU/L (ref 44–121)
BUN/Creatinine Ratio: 17 (ref 9–23)
BUN: 13 mg/dL (ref 6–24)
Bilirubin Total: 0.3 mg/dL (ref 0.0–1.2)
CO2: 25 mmol/L (ref 20–29)
Calcium: 9.4 mg/dL (ref 8.7–10.2)
Chloride: 103 mmol/L (ref 96–106)
Creatinine, Ser: 0.76 mg/dL (ref 0.57–1.00)
Globulin, Total: 2.8 g/dL (ref 1.5–4.5)
Glucose: 130 mg/dL — ABNORMAL HIGH (ref 70–99)
Potassium: 3.9 mmol/L (ref 3.5–5.2)
Sodium: 143 mmol/L (ref 134–144)
Total Protein: 7 g/dL (ref 6.0–8.5)
eGFR: 98 mL/min/{1.73_m2} (ref >59)

## 2023-10-31 LAB — CBC WITH DIFFERENTIAL/PLATELET
Basophils Absolute: 0.1 10*3/uL (ref 0.0–0.2)
Basos: 1 %
EOS (ABSOLUTE): 0.3 10*3/uL (ref 0.0–0.4)
Eos: 3 %
Hematocrit: 40.3 % (ref 34.0–46.6)
Hemoglobin: 13 g/dL (ref 11.1–15.9)
Immature Grans (Abs): 0 10*3/uL (ref 0.0–0.1)
Immature Granulocytes: 0 %
Lymphocytes Absolute: 3.9 10*3/uL — ABNORMAL HIGH (ref 0.7–3.1)
Lymphs: 38 %
MCH: 28.2 pg (ref 26.6–33.0)
MCHC: 32.3 g/dL (ref 31.5–35.7)
MCV: 87 fL (ref 79–97)
Monocytes Absolute: 1 10*3/uL — ABNORMAL HIGH (ref 0.1–0.9)
Monocytes: 10 %
Neutrophils Absolute: 5 10*3/uL (ref 1.4–7.0)
Neutrophils: 48 %
Platelets: 314 10*3/uL (ref 150–450)
RBC: 4.61 x10E6/uL (ref 3.77–5.28)
RDW: 12.5 % (ref 11.7–15.4)
WBC: 10.4 10*3/uL (ref 3.4–10.8)

## 2023-10-31 LAB — LIPID PANEL
Cholesterol, Total: 134 mg/dL (ref 100–199)
HDL: 25 mg/dL — ABNORMAL LOW (ref >39)
LDL CALC COMMENT:: 5.4 ratio — ABNORMAL HIGH (ref 0.0–4.4)
LDL Chol Calc (NIH): 55 mg/dL (ref 0–99)
Triglycerides: 354 mg/dL — ABNORMAL HIGH (ref 0–149)
VLDL Cholesterol Cal: 54 mg/dL — ABNORMAL HIGH (ref 5–40)

## 2023-11-04 ENCOUNTER — Encounter: Payer: Self-pay | Admitting: Family Medicine

## 2023-11-05 ENCOUNTER — Encounter: Payer: Self-pay | Admitting: Family Medicine

## 2024-01-14 ENCOUNTER — Ambulatory Visit (INDEPENDENT_AMBULATORY_CARE_PROVIDER_SITE_OTHER)

## 2024-01-14 ENCOUNTER — Encounter: Payer: Self-pay | Admitting: Family Medicine

## 2024-01-14 ENCOUNTER — Ambulatory Visit

## 2024-01-14 VITALS — BP 130/79 | HR 97 | Temp 98.1°F | Ht 66.0 in | Wt 194.6 lb

## 2024-01-14 DIAGNOSIS — M25511 Pain in right shoulder: Secondary | ICD-10-CM | POA: Diagnosis not present

## 2024-01-14 DIAGNOSIS — G8929 Other chronic pain: Secondary | ICD-10-CM

## 2024-01-14 MED ORDER — PREDNISONE 20 MG PO TABS
40.0000 mg | ORAL_TABLET | Freq: Every day | ORAL | 0 refills | Status: AC
Start: 2024-01-14 — End: 2024-01-19

## 2024-01-14 NOTE — Progress Notes (Signed)
 Subjective:  Patient ID: Kathleen Perez, female    DOB: [birth year], 48 y.o.   MRN: [id number]  Patient Care Team: Zollie Lowers, MD as PCP - General (Family Medicine)   Chief Complaint:  Shoulder Pain (Right x  3months )   HPI: Kathleen Perez is a 48 y.o. female presenting on 01/14/2024 for Shoulder Pain (Right x  3months )   Discussed the use of AI scribe software for clinical note transcription with the patient, who gave verbal consent to proceed.  History of Present Illness   Kathleen Perez is a 48 year old female who presents with right shoulder pain.  The right shoulder pain began three months ago after extensive gardening work, specifically repotting 2700 plants. Initially, the pain was a dull ache with a stinging sensation, and has since evolved into a persistent dull ache, particularly noticeable at night. The pain is localized to the right shoulder and radiates down the arm. It is aggravated by lying on the affected side, leading to numbness and difficulty in movement upon waking.  She has been using Tylenol  and Motrin  for pain management over the past three months, but these have provided minimal relief. She finds her shoulder most comfortable when supported by a pillow. There is no history of prior shoulder injuries or similar issues.  She has not undergone any imaging studies or consultations for this issue. She attempted to make an appointment but was unable to secure one today. Her past medical history includes high blood pressure. 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          Relevant past medical, surgical, family, and social history reviewed and updated as indicated.  Allergies and medications reviewed and updated. Data reviewed: Chart in Epic.   Past Medical History:  Diagnosis Date   GERD (gastroesophageal reflux disease)    History of kidney stones    Hypertension    Kidney stone    MRSA (methicillin  resistant staph aureus) culture positive    Scoliosis    Urinary tract infection     Past Surgical History:  Procedure Laterality Date   CYSTECTOMY     KIDNEY STONE SURGERY     UMBILICAL HERNIA REPAIR N/A 09/13/2022   Procedure: HERNIA REPAIR UMBILICAL ADULT W/ MESH;  Surgeon: Kallie Manuelita BROCKS, MD;  Location: [facility];  Service: General;  Laterality: N/A;    Social History   Socioeconomic History   Marital status: Married    Spouse name: Not on file   Number of children: Not on file   Years of education: Not on file   Highest education level: Not on file  Occupational History   Not on file  Tobacco Use   Smoking status: Former    Current packs/day: 1.50    Types: Cigarettes, E-cigarettes   Smokeless tobacco: Current  Vaping Use   Vaping status: Every Day  Substance and Sexual Activity   Alcohol use: No   Drug use: No   Sexual activity: Yes    Birth control/protection: Condom  Other Topics Concern   Not on file  Social History Narrative   Not on file   Social Drivers of Health   Financial Resource Strain: Not on file  Food Insecurity: Not on file  Transportation Needs: Not on file  Physical Activity: Not on file  Stress: Not on file  Social Connections: Not on file  Intimate Partner Violence: Not on file  Outpatient Encounter Medications as of 01/14/2024  Medication Sig   amLODipine  (NORVASC ) 10 MG tablet Take 1 tablet (10 mg total) by mouth daily.   atorvastatin  (LIPITOR) 40 MG tablet Take 1 tablet (40 mg total) by mouth daily. For cholesterol   hydrochlorothiazide  (HYDRODIURIL ) 25 MG tablet Take 1 tablet (25 mg total) by mouth daily.   medroxyPROGESTERone  Acetate 150 MG/ML SUSY INJECT 1 ML INTO THE MUSCLE EVERY 3 MONTHS   omeprazole  (PRILOSEC) 20 MG capsule Take 1 capsule (20 mg total) by mouth daily.   phentermine  37.5 MG capsule Take 1 capsule (37.5 mg total) by mouth every morning.   potassium chloride  SA (KLOR-CON  M20) 20 MEQ tablet TAKE 1  BY MOUTH  ONCE DAILY FOR POTASSIUM   predniSONE  (DELTASONE ) 20 MG tablet Take 2 tablets (40 mg total) by mouth daily with breakfast for 5 days.   [DISCONTINUED] doxycycline  (VIBRAMYCIN ) 100 MG capsule Take 1 capsule (100 mg total) by mouth 2 (two) times daily.   No facility-administered encounter medications on file as of 01/14/2024.    No Known Allergies  Pertinent ROS per HPI, otherwise unremarkable      Objective:  BP 130/79   Pulse 97   Temp 98.1 F (36.7 C)   Ht 5' 6 (1.676 m)   Wt 194 lb 9.6 oz (88.3 kg)   SpO2 98%   BMI 31.41 kg/m    Wt Readings from Last 3 Encounters:  01/14/24 194 lb 9.6 oz (88.3 kg)  10/30/23 209 lb (94.8 kg)  08/08/23 211 lb 9.6 oz (96 kg)    Physical Exam Vitals and nursing note reviewed.  Constitutional:      General: She is not in acute distress.    Appearance: Normal appearance. She is obese. She is not ill-appearing, toxic-appearing or diaphoretic.  HENT:     Head: Normocephalic and atraumatic.     Nose: Nose normal.     Mouth/Throat:     Mouth: Mucous membranes are moist.  Eyes:     Conjunctiva/sclera: Conjunctivae normal.  Cardiovascular:     Rate and Rhythm: Normal rate and regular rhythm.     Pulses: Normal pulses.     Heart sounds: Normal heart sounds.  Pulmonary:     Effort: Pulmonary effort is normal.     Breath sounds: Normal breath sounds.  Musculoskeletal:     Right shoulder: Tenderness present. No swelling, deformity, effusion, laceration, bony tenderness or crepitus. Decreased range of motion. Normal strength. Normal pulse.     Left shoulder: Normal.     Right upper arm: Normal.     Cervical back: Normal range of motion and neck supple. No edema, erythema, signs of trauma, rigidity, torticollis, tenderness or crepitus. No pain with movement, spinous process tenderness or muscular tenderness. Normal range of motion.  Lymphadenopathy:     Cervical: No cervical adenopathy.  Skin:    General: Skin is warm and dry.     Capillary  Refill: Capillary refill takes less than 2 seconds.  Neurological:     General: No focal deficit present.     Mental Status: She is alert and oriented to person, place, and time.  Psychiatric:        Mood and Affect: Mood normal.        Behavior: Behavior normal.        Thought Content: Thought content normal.        Judgment: Judgment normal.         X-Ray: right shoulder: widening of A  joint. Preliminary x-ray reading by Rosaline Bruns, FNP-C, [facility].   Results for orders placed or performed in visit on 10/30/23  CBC with Differential/Platelet   Collection Time: 10/30/23  4:33 PM  Result Value Ref Range   WBC 10.4 3.4 - 10.8 x10E3/uL   RBC 4.61 3.77 - 5.28 x10E6/uL   Hemoglobin 13.0 11.1 - 15.9 g/dL   Hematocrit 59.6 65.9 - 46.6 %   MCV 87 79 - 97 fL   MCH 28.2 26.6 - 33.0 pg   MCHC 32.3 31.5 - 35.7 g/dL   RDW 87.4 88.2 - 84.5 %   Platelets 314 150 - 450 x10E3/uL   Neutrophils 48 Not Estab. %   Lymphs 38 Not Estab. %   Monocytes 10 Not Estab. %   Eos 3 Not Estab. %   Basos 1 Not Estab. %   Neutrophils Absolute 5.0 1.4 - 7.0 x10E3/uL   Lymphocytes Absolute 3.9 (H) 0.7 - 3.1 x10E3/uL   Monocytes Absolute 1.0 (H) 0.1 - 0.9 x10E3/uL   EOS (ABSOLUTE) 0.3 0.0 - 0.4 x10E3/uL   Basophils Absolute 0.1 0.0 - 0.2 x10E3/uL   Immature Granulocytes 0 Not Estab. %   Immature Grans (Abs) 0.0 0.0 - 0.1 x10E3/uL  CMP14+EGFR   Collection Time: 10/30/23  4:33 PM  Result Value Ref Range   Glucose 130 (H) 70 - 99 mg/dL   BUN 13 6 - 24 mg/dL   Creatinine, Ser 9.23 0.57 - 1.00 mg/dL   eGFR 98 >40 fO/fpw/8.26   BUN/Creatinine Ratio 17 9 - 23   Sodium 143 134 - 144 mmol/L   Potassium 3.9 3.5 - 5.2 mmol/L   Chloride 103 96 - 106 mmol/L   CO2 25 20 - 29 mmol/L   Calcium  9.4 8.7 - 10.2 mg/dL   Total Protein 7.0 6.0 - 8.5 g/dL   Albumin 4.2 3.9 - 4.9 g/dL   Globulin, Total 2.8 1.5 - 4.5 g/dL   Bilirubin Total 0.3 0.0 - 1.2 mg/dL   Alkaline Phosphatase 113 44 - 121 IU/L   AST 18 0 - 40  IU/L   ALT 22 0 - 32 IU/L  Lipid panel   Collection Time: 10/30/23  4:33 PM  Result Value Ref Range   Cholesterol, Total 134 100 - 199 mg/dL   Triglycerides 645 (H) 0 - 149 mg/dL   HDL 25 (L) >60 mg/dL   VLDL Cholesterol Cal 54 (H) 5 - 40 mg/dL   LDL Chol Calc (NIH) 55 0 - 99 mg/dL   Chol/HDL Ratio 5.4 (H) 0.0 - 4.4 ratio       Pertinent labs & imaging results that were available during my care of the patient were reviewed by me and considered in my medical decision making.  Assessment & Plan:  Kathleen Perez was seen today for shoulder pain.  Diagnoses and all orders for this visit:  Chronic right shoulder pain -     DG Shoulder Right -     Ambulatory referral to Physical Therapy -     predniSONE  (DELTASONE ) 20 MG tablet; Take 2 tablets (40 mg total) by mouth daily with breakfast for 5 days.     Right shoulder pain Chronic right shoulder pain for three months, likely due to [facility] joint separation. Symptoms include dull ache, stinging, and numbness, exacerbated by certain movements and positions. Differential diagnosis includes shoulder impingement, rotator cuff tear, and frozen shoulder. NSAIDs are avoided due to hypertension and potential renal function impact. Prednisone  burst is chosen to reduce inflammation without  affecting renal function. Physical therapy is planned to improve shoulder function. If physical therapy is not beneficial or x-ray shows further issues, an orthopedic referral will be considered. - Order right shoulder x-ray - Prescribe prednisone  burst: two pills daily for five days - Refer to physical therapy in [place] - Provide Tylenol  Arthritis Topical for symptomatic relief - Advise follow-up in four to six weeks to assess need for orthopedic referral if symptoms persist    Continue all other maintenance medications.  Follow up plan: Return in about 6 weeks (around 02/25/2024) for PCP for shoulder pain.   Continue healthy lifestyle choices, including diet (rich in  fruits, vegetables, and lean proteins, and low in salt and simple carbohydrates) and exercise (at least 30 minutes of moderate physical activity daily).  Educational handout given for shoulder pain  The above assessment and management plan was discussed with the patient. The patient verbalized understanding of and has agreed to the management plan. Patient is aware to call the clinic if they develop any new symptoms or if symptoms persist or worsen. Patient is aware when to return to the clinic for a follow-up visit. Patient educated on when it is appropriate to go to the emergency department.   Rosaline Bruns, FNP-C [facility] [phone]

## 2024-01-21 ENCOUNTER — Ambulatory Visit: Payer: Self-pay | Admitting: Family Medicine

## 2024-01-31 ENCOUNTER — Ambulatory Visit: Attending: Family Medicine

## 2024-01-31 DIAGNOSIS — G8929 Other chronic pain: Secondary | ICD-10-CM | POA: Insufficient documentation

## 2024-01-31 DIAGNOSIS — M25511 Pain in right shoulder: Secondary | ICD-10-CM | POA: Diagnosis present

## 2024-01-31 NOTE — Addendum Note (Signed)
 Addended by: ELSPETH LACINDA BROCKS on: 01/31/2024 12:53 PM   Modules accepted: Orders

## 2024-01-31 NOTE — Therapy (Addendum)
 OUTPATIENT PHYSICAL THERAPY UPPER EXTREMITY EVALUATION   Patient Name: MILEYDI MILSAP MRN: [id number] DOB:28-Mar-1976, 48 y.o., female Today's Date: 01/31/2024  END OF SESSION:  PT End of Session - 01/31/24 1047     Visit Number 1    Number of Visits 12    Date for PT Re-Evaluation 03/20/24    PT Start Time 1100    PT Stop Time 1144    PT Time Calculation (min) 44 min    Activity Tolerance Patient tolerated treatment well    Behavior During Therapy WFL for tasks assessed/performed          Past Medical History:  Diagnosis Date   GERD (gastroesophageal reflux disease)    History of kidney stones    Hypertension    Kidney stone    MRSA (methicillin resistant staph aureus) culture positive    Scoliosis    Urinary tract infection    Past Surgical History:  Procedure Laterality Date   CYSTECTOMY     KIDNEY STONE SURGERY     UMBILICAL HERNIA REPAIR N/A 09/13/2022   Procedure: HERNIA REPAIR UMBILICAL ADULT W/ MESH;  Surgeon: Kallie Manuelita BROCKS, MD;  Location: [facility];  Service: General;  Laterality: N/A;   Patient Active Problem List   Diagnosis Date Noted   Umbilical hernia without obstruction or gangrene 08/02/2022   Elevated blood pressure reading 09/20/2020   Primary hypertension 09/20/2020   Encounter for female birth control 09/20/2020    PCP: Zollie Lowers, MD  REFERRING PROVIDER: Severa Rock HERO, FNP  REFERRING DIAG: Chronic right shoulder pain   THERAPY DIAG:  Chronic right shoulder pain  Rationale for Evaluation and Treatment: Rehabilitation  ONSET DATE: 4 months ago  SUBJECTIVE:                                                                                                                                                                                      SUBJECTIVE STATEMENT:  Patient reports to the clinic with chief complaint of R shoulder pain. Over a period of 4 months or so, R shoulder has gotten worse. She states she felt a stinging hot/cold  sensation and tingling, but then became a dull ache. Patient states she's never had issue with the shoulder before. Patient states the tingling sensation has since gone away. Patient had x-rays on 01/14/2024 which they told her it was joint and cartilage separation. She has taken a course of prednisone  that worked really well but can't take it anymore.  Patient states they have been having headaches since the shoulder pain started, and sometimes gets dizzy. Patient states they are waking up in the night due to R shoulder  pain. Pt denies cancer, bowel or bladder issues, no unexplainable change in weight.  Hand dominance: Right  PERTINENT HISTORY: HTN, Scoliosis, GERD  PAIN:  Are you having pain? Yes: NPRS scale: current 3/10,worst 6-7/10 Pain location: anterior shoulder Pain description: Dull, sharp stinging with movement Aggravating factors: IR Relieving factors: Crossed over body, shoulder add.  PRECAUTIONS: None  RED FLAGS: None   WEIGHT BEARING RESTRICTIONS: No  FALLS:  Has patient fallen in last 6 months? No  LIVING ENVIRONMENT: Lives with: lives with their family and lives with their spouse Lives in: House/apartment   OCCUPATION: Manufacturing filters  PLOF: Independent  PATIENT GOALS: decrease pain, sleep through the night.  NEXT MD VISIT: 02/25/2024  OBJECTIVE:  Note: Objective measures were completed at Evaluation unless otherwise noted.  DIAGNOSTIC FINDINGS:  X Ray on 01/14/2024 No acute abnormality noted.   PATIENT SURVEYS :  Quickdash: 50  COGNITION: Overall cognitive status: Within functional limits for tasks assessed     SENSATION: WFL   UPPER EXTREMITY ROM:   Active ROM Right eval Left eval  Shoulder flexion 150 PROM 160 170  Shoulder extension 10 degrees   Shoulder abduction 160- sitting PROM in supine 90-painful   Shoulder adduction    Shoulder internal rotation To belly Painful    Shoulder external rotation 60  at 0 degrees abd    Elbow flexion WNL painful   Elbow extension WNL   Wrist flexion WNL   Wrist extension    Wrist ulnar deviation    Wrist radial deviation    Wrist pronation painful   Wrist supination    (Blank rows = not tested)  UPPER EXTREMITY MMT: Not tested due to pain  SHOULDER SPECIAL TESTS: SLAP lesions: Neers negative Rotator cuff assessment: Empty can test: negative, Full can test: negative, and Infraspinatus test: positive  ER-painful    JOINT MOBILITY TESTING:  AP normal Inferior hypomobile not painful PA normal   PALPATION:  TTP at anterior deltoid, bicep, pec insertion                                                                                                                             TREATMENT DATE:    PATIENT EDUCATION: Education details: HEP, POC, shoulder pathology, possible small supraspinatus tear Person educated: Patient Education method: Explanation, Demonstration, Tactile cues, Verbal cues, and Handouts Education comprehension: verbalized understanding and returned demonstration  HOME EXERCISE PROGRAM: Shoulder Isometrics, flex,ext,abd 2x/day 10s ea,  aarom bicep curl w/towel squeeze 2x/day 10 times  ASSESSMENT:  CLINICAL IMPRESSION: Patient is a 48 y.o. F who was seen today for physical therapy evaluation and treatment for R shoulder pain that began about 4 months ago with insidious onset. Patient presents with pain that is now dull and achey after bouts of sharp tingling pain to the anterior shoulder. Patient demonstrates slight decreased AROM in all shoulder motions, with significant decreased PROM in R shoulder abduction. Joint mobility was normal and pain free. Positive  infraspinatus test, however, negative for empty can, full can, and neers. Strength wasn't assessed today due to pain, but plan to assess as tolerable. Patient was TTP along R anterior deltoid, supraspinatus musculature, and biceps. Patient was educated about pain management and possible  small tear to the musculature. Patient given HEP with isometrics to begin gentle muscle activation and pain modulation today. Patient requires skilled physical therapy to address impairments below.   OBJECTIVE IMPAIRMENTS: decreased activity tolerance, decreased endurance, decreased mobility, decreased ROM, hypomobility, impaired UE functional use, and pain.   ACTIVITY LIMITATIONS: carrying, sleeping, transfers, bed mobility, bathing, dressing, self feeding, and reach over head  PARTICIPATION LIMITATIONS: meal prep, cleaning, laundry, driving, community activity, occupation, and yard work  PERSONAL FACTORS: Fitness, Time since onset of injury/illness/exacerbation, and 1-2 comorbidities: HTN are also affecting patient's functional outcome.   REHAB POTENTIAL: Good  CLINICAL DECISION MAKING: Stable/uncomplicated  EVALUATION COMPLEXITY: Low  GOALS: Goals reviewed with patient? No  SHORT TERM GOALS: Target date: 02/21/2024  Patient will be independent with HEP Baseline: Goal status: INITIAL  2.  Patient will exhibit 160 degrees AROM R shoulder flexion in order to reach overhead Baseline:  Goal status: INITIAL  3.  Patient will be able to sleep with no greater than 3/10 pain in order to improve quality of life Baseline:  Goal status: INITIAL  LONG TERM GOALS: Target date: 03/13/2024  Patient will demonstrate full , pain free, AROM R shoulder flexion in order to reach over head at work without pain Baseline:  Goal status: INITIAL  2.  Patient will improve quickdash score by 10 points in order to improve ability to complete ADLs  Baseline:  Goal status: INITIAL  3.  Patient will demonstrate 160 degrees active and passive ROM in R shoulder abduction in order to improve mobility  Baseline:  Goal status: INITIAL  4.  Patient will demonstrate at least 65 degrees of IR at 90 degrees of shoulder abduction in order to wash their back in the shower Baseline:  Goal status:  INITIAL   PLAN: PT FREQUENCY: 2x/week  PT DURATION: 6 weeks  PLANNED INTERVENTIONS: 97164- PT Re-evaluation, 97750- Physical Performance Testing, 97110-Therapeutic exercises, 97530- Therapeutic activity, [id number]- Neuromuscular re-education, 97535- Self Care, 02859- Manual therapy, [id number]- Electrical stimulation (unattended), [id number]- Electrical stimulation (manual), 97016- Vasopneumatic device, [id number]- Ultrasound, 02987- Traction (mechanical), Patient/Family education, Balance training, Taping, Joint mobilization, Spinal mobilization, Cryotherapy, and Moist heat  PLAN FOR NEXT SESSION: shoulder isometrics, aarom, gentle strengthening   Estefana Jude, Student-PT 01/31/2024, 12:42 PM

## 2024-02-04 ENCOUNTER — Ambulatory Visit: Admitting: *Deleted

## 2024-02-07 ENCOUNTER — Encounter: Admitting: Physical Therapy

## 2024-02-10 ENCOUNTER — Ambulatory Visit

## 2024-02-25 ENCOUNTER — Ambulatory Visit: Admitting: Family Medicine

## 2024-03-10 ENCOUNTER — Ambulatory Visit: Payer: Self-pay

## 2024-03-10 ENCOUNTER — Encounter: Payer: Self-pay | Admitting: Family Medicine

## 2024-03-10 ENCOUNTER — Other Ambulatory Visit: Payer: Self-pay | Admitting: Family Medicine

## 2024-03-10 ENCOUNTER — Ambulatory Visit: Admitting: Family Medicine

## 2024-03-10 VITALS — BP 110/71 | HR 95 | Temp 98.5°F | Ht 66.0 in | Wt 197.0 lb

## 2024-03-10 DIAGNOSIS — Z23 Encounter for immunization: Secondary | ICD-10-CM

## 2024-03-10 DIAGNOSIS — N12 Tubulo-interstitial nephritis, not specified as acute or chronic: Secondary | ICD-10-CM

## 2024-03-10 DIAGNOSIS — R300 Dysuria: Secondary | ICD-10-CM

## 2024-03-10 LAB — URINALYSIS, ROUTINE W REFLEX MICROSCOPIC
Bilirubin, UA: NEGATIVE
Glucose, UA: NEGATIVE
Ketones, UA: NEGATIVE
Nitrite, UA: NEGATIVE
Protein,UA: NEGATIVE
RBC, UA: NEGATIVE
Specific Gravity, UA: 1.02 (ref 1.005–1.030)
Urobilinogen, Ur: 0.2 mg/dL (ref 0.2–1.0)
pH, UA: 6.5 (ref 5.0–7.5)

## 2024-03-10 LAB — MICROSCOPIC EXAMINATION

## 2024-03-10 MED ORDER — CIPROFLOXACIN HCL 500 MG PO TABS: 500.0000 mg | ORAL_TABLET | Freq: Two times a day (BID) | ORAL | 0 refills | Status: AC

## 2024-03-10 NOTE — Progress Notes (Signed)
 Subjective:  Patient ID: Kathleen Perez, female    DOB: April 30, 1976  Age: 48 y.o. MRN: [id number]  CC: Dysuria   HPI  Discussed the use of AI scribe software for clinical note transcription with the patient, who gave verbal consent to proceed.  History of Present Illness Kathleen Perez is a 48 year old female who presents with urinary symptoms including frequency, burning, and itching.  She has been experiencing urinary frequency and a burning sensation during urination, accompanied by severe itching, for approximately four days. She also notes a low-grade fever of 99.54F two nights ago, which she managed with Tylenol , and mild nausea last night.  She describes a dull, achy pain in the flank area, primarily on the left side, with some discomfort on the right side as well.  She has a history of using Bactrim for skin MRSA, but it was not as effective in recent uses. She has not reported any significant discharge, which she finds confusing given the itching.  She is currently dealing with shoulder issues, including cartilage and joint separation, and is planning to resume physical therapy after managing her financial priorities.          03/10/2024    3:22 PM 10/30/2023    3:58 PM 01/21/2023   10:32 AM  Depression screen PHQ 2/9  Decreased Interest 1 3 3   Down, Depressed, Hopeless 0 3 3  PHQ - 2 Score 1 6 6   Altered sleeping 3 3 3   Tired, decreased energy 2 3 3   Change in appetite 2 3 0  Feeling bad or failure about yourself  0 1 0  Trouble concentrating 0 3 3  Moving slowly or fidgety/restless 0 1 0  Suicidal thoughts 0 1 0  PHQ-9 Score 8 21 15   Difficult doing work/chores  Somewhat difficult Somewhat difficult    History Kathleen Perez has a past medical history of GERD (gastroesophageal reflux disease), History of kidney stones, Hypertension, Kidney stone, MRSA (methicillin resistant staph aureus) culture positive, Scoliosis, and Urinary tract infection.   She has a past  surgical history that includes Kidney stone surgery; Cystectomy; and Umbilical hernia repair (N/A, 09/13/2022).   Her family history is not on file.She reports that she has quit smoking. Her smoking use included cigarettes and e-cigarettes. She uses smokeless tobacco. She reports that she does not drink alcohol and does not use drugs.    ROS Review of Systems  Constitutional:  Negative for chills, diaphoresis and fever.  HENT:  Negative for congestion.   Eyes:  Negative for visual disturbance.  Respiratory:  Negative for cough and shortness of breath.   Cardiovascular:  Negative for chest pain and palpitations.  Gastrointestinal:  Positive for nausea. Negative for constipation and diarrhea.  Genitourinary:  Positive for dysuria, flank pain, frequency and urgency. Negative for decreased urine volume, hematuria, menstrual problem and pelvic pain.  Musculoskeletal:  Negative for arthralgias and joint swelling.  Skin:  Negative for rash.  Neurological:  Negative for dizziness and numbness.    Objective:  BP 110/71   Pulse 95   Temp 98.5 F (36.9 C)   Ht 5' 6 (1.676 m)   Wt 197 lb (89.4 kg)   SpO2 97%   BMI 31.80 kg/m   BP Readings from Last 3 Encounters:  03/10/24 110/71  01/14/24 130/79  10/30/23 135/76    Wt Readings from Last 3 Encounters:  03/10/24 197 lb (89.4 kg)  01/14/24 194 lb 9.6 oz (88.3 kg)  10/30/23 209 lb (  94.8 kg)     Physical Exam Constitutional:      General: She is not in acute distress.    Appearance: She is well-developed.  Cardiovascular:     Rate and Rhythm: Normal rate and regular rhythm.  Pulmonary:     Breath sounds: Normal breath sounds.  Musculoskeletal:        General: Tenderness (Flanks/ CVA bilaterally, moderate) present. Normal range of motion.  Skin:    General: Skin is warm and dry.  Neurological:     Mental Status: She is alert and oriented to person, place, and time.      Assessment & Plan:  Pyelonephritis  Dysuria -      Urine Culture -     Urinalysis, Routine w reflex microscopic  Other orders -     Ciprofloxacin  HCl; Take 1 tablet (500 mg total) by mouth 2 (two) times daily.  Dispense: 20 tablet; Refill: 0    Assessment and Plan Assessment & Plan Urinary tract infection with possible upper tract involvement   She reports urinary symptoms including frequency, burning, and itching for four days, with a low-grade fever two nights ago. Dull left flank pain suggests possible upper tract involvement. The differential includes urinary tract infection and yeast infection, but symptoms and urine specimen indicate an infection likely ascending towards the kidney. Previous use of Bactrim for MRSA may have reduced its efficacy, so Ciprofloxacin  is preferred. Prescribe Ciprofloxacin  500 mg orally twice a day for 10 days. Send urine culture to confirm infection and adjust antibiotics if necessary based on culture results. Advise use of Tylenol  for pain management. Recommend rest and application of heat to the back for pain relief. Provide a doctor's note for work absence for the remainder of the day.       Follow-up: Return if symptoms worsen or fail to improve.  Butler Der, M.D.

## 2024-03-10 NOTE — Telephone Encounter (Signed)
 Appt made for dod

## 2024-03-10 NOTE — Telephone Encounter (Addendum)
  FYI Only or Action Required?: FYI only for provider.  Patient was last seen in primary care on 01/14/2024 by Severa Rock HERO, FNP.  Called Nurse Triage reporting Pain.  Symptoms began x 4 days.  Interventions attempted: Nothing.  Symptoms are: gradually worsening.  Triage Disposition: See Physician Within 24 Hours  Patient/caregiver understands and will follow disposition?: Yes    Copied from CRM [id number]. Topic: Clinical - Red Word Triage >> Mar 10, 2024 11:24 AM Roselie BROCKS wrote: [facility] that prompted transfer to Nurse Triage: Patient has a lot of pressure when trying to urinate, severe back pain,and urine is dark colored Reason for Disposition  Urinating more frequently than usual (i.e., frequency) OR new-onset of the feeling of an urgent need to urinate (i.e., urgency)  Answer Assessment - Initial Assessment Questions 1. SYMPTOM: What's the main symptom you're concerned about? (e.g., frequency, incontinence)     Pressure with urinating , frequently, urgency 2. ONSET: When did the  start?     X 4 days 3. PAIN: Is there any pain? If Yes, ask: How bad is it? (Scale: 1-10; mild, moderate, severe)     Moderate  4. CAUSE: What do you think is causing the symptoms?     UTI 5. OTHER SYMPTOMS: Do you have any other symptoms? (e.g., blood in urine, fever, flank pain, pain with urination)     Low back pain, pain with urination  6. PREGNANCY: Is there any chance you are pregnant? When was your last menstrual period?     Na  Protocols used: Urinary Symptoms-A-AH

## 2024-03-12 LAB — URINE CULTURE

## 2024-07-07 ENCOUNTER — Encounter: Payer: Self-pay | Admitting: Nurse Practitioner

## 2024-07-07 ENCOUNTER — Telehealth: Payer: Self-pay | Admitting: Nurse Practitioner

## 2024-07-07 ENCOUNTER — Ambulatory Visit: Payer: Self-pay

## 2024-07-07 DIAGNOSIS — A084 Viral intestinal infection, unspecified: Secondary | ICD-10-CM

## 2024-07-07 MED ORDER — ONDANSETRON HCL 4 MG PO TABS: 4.0000 mg | ORAL_TABLET | Freq: Three times a day (TID) | ORAL | 0 refills | Status: AC | PRN

## 2024-07-07 NOTE — Progress Notes (Signed)
 "   Virtual Visit Consent   Kathleen Perez, you are scheduled for a virtual visit with Mary-Margaret Gladis, FNP, a [facility] provider, today.     Just as with appointments in the office, your consent must be obtained to participate.  Your consent will be active for this visit and any virtual visit you may have with one of our providers in the next 365 days.     If you have a MyChart account, a copy of this consent can be sent to you electronically.  All virtual visits are billed to your insurance company just like a traditional visit in the office.    As this is a virtual visit, video technology does not allow for your provider to perform a traditional examination.  This may limit your provider's ability to fully assess your condition.  If your provider identifies any concerns that need to be evaluated in person or the need to arrange testing (such as labs, EKG, etc.), we will make arrangements to do so.     Although advances in technology are sophisticated, we cannot ensure that it will always work on either your end or our end.  If the connection with a video visit is poor, the visit may have to be switched to a telephone visit.  With either a video or telephone visit, we are not always able to ensure that we have a secure connection.     I need to obtain your verbal consent now.   Are you willing to proceed with your visit today? YES   Kathleen Perez has provided verbal consent on 07/07/2024 for a virtual visit (video or telephone).   Mary-Margaret Gladis, FNP   Date: 07/07/2024 1:49 PM   Virtual Visit via Video Note   I, Mary-Margaret Gladis, connected with Kathleen Perez ([id number], June 19, 1976) on 07/07/2024 at  4:45 PM EST by a video-enabled telemedicine application and verified that I am speaking with the correct person using two identifiers.  Location: Patient: Virtual Visit Location Patient: Home Provider: Virtual Visit Location Provider: Mobile   I discussed the limitations of  evaluation and management by telemedicine and the availability of in person appointments. The patient expressed understanding and agreed to proceed.    History of Present Illness: Kathleen Perez is a 49 y.o. who identifies as a female who was assigned female at birth, and is being seen today for nausea.  HPI: HPI  Patient does video visit c/o abdominal pain and nausea. Started last night. Waxing and waining. No vomiting. No fever. Pain is intromittent. Pain is located in epigastric area. Slight diarrhea. Decreased appetite  Review of Systems  Constitutional:  Negative for diaphoresis and weight loss.  Eyes:  Negative for blurred vision, double vision and pain.  Respiratory:  Negative for shortness of breath.   Cardiovascular:  Negative for chest pain, palpitations, orthopnea and leg swelling.  Gastrointestinal:  Positive for nausea. Negative for abdominal pain and vomiting.  Skin:  Negative for rash.  Neurological:  Negative for dizziness, sensory change, loss of consciousness, weakness and headaches.  Endo/Heme/Allergies:  Negative for polydipsia. Does not bruise/bleed easily.  Psychiatric/Behavioral:  Negative for memory loss. The patient does not have insomnia.   All other systems reviewed and are negative.   Problems:  Patient Active Problem List   Diagnosis Date Noted   Umbilical hernia without obstruction or gangrene 08/02/2022   Elevated blood pressure reading 09/20/2020   Primary hypertension 09/20/2020   Encounter for female birth control  09/20/2020    Allergies: Allergies[1] Medications: Current Medications[2]  Observations/Objective: Patient is well-developed, well-nourished in no acute distress.  Resting comfortably  at home.  Head is normocephalic, atraumatic.  No labored breathing.  Speech is clear and coherent with logical content.  Patient is alert and oriented at baseline.    Assessment and Plan:  Kathleen Perez in today with chief complaint of nausea  1.  Viral gastroenteritis (Primary) First 24 Hours-Clear liquids  popsicles  Jello  gatorade  Sprite Second 24 hours-Add Full liquids ( Liquids you cant see through) Third 24 hours- Bland diet ( foods that are baked or broiled)  *avoiding fried foods and highly spiced foods* During these 3 days  Avoid milk, cheese, ice cream or any other dairy products  Avoid caffeine- REMEMBER Mt. Dew and Mello Yellow contain lots of caffeine You should eat and drink in  Frequent small volumes If no improvement in symptoms or worsen in 2-3 days should RETRUN TO OFFICE or go to ER!   Meds ordered this encounter  Medications   ondansetron  (ZOFRAN ) 4 MG tablet    Sig: Take 1 tablet (4 mg total) by mouth every 8 (eight) hours as needed for nausea or vomiting.    Dispense:  20 tablet    Refill:  0    Supervising Provider:   MARYANNE CHEW A [[id number]]      Follow Up Instructions: I discussed the assessment and treatment plan with the patient. The patient was provided an opportunity to ask questions and all were answered. The patient agreed with the plan and demonstrated an understanding of the instructions.  A copy of instructions were sent to the patient via MyChart.  The patient was advised to call back or seek an in-person evaluation if the symptoms worsen or if the condition fails to improve as anticipated.  Time:  I spent 11 minutes with the patient via telehealth technology discussing the above problems/concerns.    Mary-Margaret Gladis, FNP     [1] No Known Allergies [2]  Current Outpatient Medications:    amLODipine  (NORVASC ) 10 MG tablet, Take 1 tablet (10 mg total) by mouth daily., Disp: 90 tablet, Rfl: 1   atorvastatin  (LIPITOR) 40 MG tablet, Take 1 tablet (40 mg total) by mouth daily. For cholesterol, Disp: 90 tablet, Rfl: 3   ciprofloxacin  (CIPRO ) 500 MG tablet, Take 1 tablet (500 mg total) by mouth 2 (two) times daily., Disp: 20 tablet, Rfl: 0   hydrochlorothiazide  (HYDRODIURIL ) 25  MG tablet, Take 1 tablet (25 mg total) by mouth daily., Disp: 90 tablet, Rfl: 1   medroxyPROGESTERone  Acetate 150 MG/ML SUSY, INJECT 1 ML INTO THE MUSCLE EVERY 3 MONTHS, Disp: 1 mL, Rfl: 3   omeprazole  (PRILOSEC) 20 MG capsule, Take 1 capsule (20 mg total) by mouth daily., Disp: 90 capsule, Rfl: 1   phentermine  37.5 MG capsule, Take 1 capsule (37.5 mg total) by mouth every morning., Disp: 30 capsule, Rfl: 2   potassium chloride  SA (KLOR-CON  M) 20 MEQ tablet, TAKE 1 TABLET BY MOUTH ONCE DAILY FOR POTASSIUM **NEEDS TO BE SEEN BEFORE NEXT REFILL**, Disp: 30 tablet, Rfl: 0  "

## 2024-07-07 NOTE — Patient Instructions (Addendum)
First 24 Hours-Clear liquids  popsicles  Jello  gatorade  Sprite Second 24 hours-Add Full liquids ( Liquids you cant see through) Third 24 hours- Bland diet ( foods that are baked or broiled)  *avoiding fried foods and highly spiced foods* During these 3 days  Avoid milk, cheese, ice cream or any other dairy products  Avoid caffeine- REMEMBER Mt. Dew and Mello Yellow contain lots of caffeine You should eat and drink in  Frequent small volumes If no improvement in symptoms or worsen in 2-3 days should RETRUN TO OFFICE or go to ER!     

## 2024-07-07 NOTE — Telephone Encounter (Signed)
Noted  -LS

## 2024-07-07 NOTE — Telephone Encounter (Signed)
 FYI Only or Action Required?: FYI only for provider: ED advised.  Patient was last seen in primary care on 03/10/2024 by Zollie Lowers, MD.  Called Nurse Triage reporting Abdominal Pain and Nausea.  Symptoms began yesterday.  Interventions attempted: Nothing.  Symptoms are: gradually worsening.  Triage Disposition: Go to ED Now (Notify PCP)  Patient/caregiver understands and will follow disposition?: Unsure  Copied from CRM [id number]. Topic: Clinical - Red Word Triage >> Jul 07, 2024 10:04 AM Zane F wrote: Red Word that prompted transfer to Nurse Triage:   Concern: Not feeling well   Symptoms:  Nausea  Weak  Shaky  Stomach Pain ( Pain Level (1-10): 7)   When did the symptoms start?: Middle of the night   What have you done to aid in the concern ? Have you taken anything to assist with the matter?: No Reason for Disposition  [1] SEVERE pain (e.g., excruciating) AND [2] present > 1 hour  [1] Pain lasts > 10 minutes AND [2] age > 23 AND [3] at least one cardiac risk factor (e.g., diabetes, high cholesterol, hypertension, obesity, smoker or strong family history of heart disease)  Answer Assessment - Initial Assessment Questions 1. LOCATION: Where does it hurt?      Pit of my stomach, epigastric pain  2. RADIATION: Does the pain shoot anywhere else? (e.g., chest, back)     Denies  3. ONSET: When did the pain begin? (e.g., minutes, hours or days ago)      Last night  4. SUDDEN: Gradual or sudden onset?     Sudden  5. PATTERN Does the pain come and go, or is it constant?     Intermittent  6. SEVERITY: How bad is the pain?  (e.g., Scale 1-10; mild, moderate, or severe)     Like severe gas, 7/10  7. RECURRENT SYMPTOM: Have you ever had this type of stomach pain before? If Yes, ask: When was the last time? and What happened that time?      Denies  8. CAUSE: What do you think is causing the stomach pain? (e.g., gallstones, recent  abdominal surgery)     Pt unsure   10. OTHER SYMPTOMS: Do you have any other symptoms? (e.g., back pain, diarrhea, fever, urination pain, vomiting) Weak, shaky, feel icky, nausea  Answer Assessment - Initial Assessment Questions .  Protocols used: Abdominal Pain - Female-A-AH, Abdominal Pain - Upper-A-AH

## 2024-07-08 ENCOUNTER — Ambulatory Visit

## 2024-07-15 ENCOUNTER — Other Ambulatory Visit: Payer: Self-pay | Admitting: Family Medicine

## 2024-07-15 DIAGNOSIS — I10 Essential (primary) hypertension: Secondary | ICD-10-CM
